# Patient Record
Sex: Female | Born: 1995 | Race: Black or African American | Hispanic: No | Marital: Single | Smoking: Never smoker
Health system: Southern US, Community
[De-identification: ages and names within clinical notes are randomized; demographics above are authoritative.]

## PROBLEM LIST (undated history)

## (undated) ENCOUNTER — Inpatient Hospital Stay (HOSPITAL_COMMUNITY): Payer: Self-pay

## (undated) DIAGNOSIS — J45909 Unspecified asthma, uncomplicated: Secondary | ICD-10-CM

## (undated) DIAGNOSIS — J302 Other seasonal allergic rhinitis: Secondary | ICD-10-CM

## (undated) DIAGNOSIS — K219 Gastro-esophageal reflux disease without esophagitis: Secondary | ICD-10-CM

## (undated) DIAGNOSIS — L309 Dermatitis, unspecified: Secondary | ICD-10-CM

## (undated) HISTORY — PX: OTHER SURGICAL HISTORY: SHX169

## (undated) HISTORY — DX: Unspecified asthma, uncomplicated: J45.909

---
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
## 2015-07-22 ENCOUNTER — Emergency Department (HOSPITAL_BASED_OUTPATIENT_CLINIC_OR_DEPARTMENT_OTHER)
Admission: EM | Admit: 2015-07-22 | Discharge: 2015-07-22 | Disposition: A | Discharge status: Home / Self Care | Payer: Medicaid Other | Attending: Emergency Medicine | Admitting: Emergency Medicine

## 2015-07-22 ENCOUNTER — Encounter (HOSPITAL_BASED_OUTPATIENT_CLINIC_OR_DEPARTMENT_OTHER): Payer: Self-pay

## 2015-07-22 DIAGNOSIS — J45901 Unspecified asthma with (acute) exacerbation: Secondary | ICD-10-CM | POA: Insufficient documentation

## 2015-07-22 MED ORDER — IPRATROPIUM-ALBUTEROL 0.5-2.5 (3) MG/3ML IN SOLN
3.0000 mL | Freq: Four times a day (QID) | RESPIRATORY_TRACT | Status: DC
  Administered 2015-07-22: 3 mL via RESPIRATORY_TRACT
  Filled 2015-07-22: qty 3

## 2015-07-22 MED ORDER — ALBUTEROL SULFATE HFA 108 (90 BASE) MCG/ACT IN AERS
2.0000 | INHALATION_SPRAY | Freq: Once | RESPIRATORY_TRACT | Status: AC
  Administered 2015-07-22: 2 via RESPIRATORY_TRACT
  Filled 2015-07-22: qty 6.7

## 2015-07-22 MED ORDER — DEXAMETHASONE SODIUM PHOSPHATE 10 MG/ML IJ SOLN
10.0000 mg | Freq: Once | INTRAMUSCULAR | Status: AC
  Administered 2015-07-22: 10 mg via INTRAMUSCULAR
  Filled 2015-07-22: qty 1

## 2015-07-22 MED ORDER — ALBUTEROL SULFATE (2.5 MG/3ML) 0.083% IN NEBU
2.5000 mg | INHALATION_SOLUTION | Freq: Once | RESPIRATORY_TRACT | Status: AC
  Administered 2015-07-22: 2.5 mg via RESPIRATORY_TRACT
  Filled 2015-07-22: qty 3

## 2015-07-22 NOTE — ED Notes (Signed)
Discharge instructions understood by patient. Pt ambulatory, A&O x 4 at discharge.

## 2015-07-22 NOTE — ED Notes (Signed)
RT at bedside.

## 2015-07-22 NOTE — ED Notes (Signed)
Pt reports asthma attack x 30-40 minutes. Pt reports she doesn't have inhaler with her.

## 2015-07-22 NOTE — ED Provider Notes (Signed)
CSN: [id number]     Arrival date & time 07/22/15  0240 History   First MD Initiated Contact with Patient 07/22/15 [phone]     Chief Complaint  Patient presents with  . Asthma     (Consider location/radiation/quality/duration/timing/severity/associated sxs/prior Treatment) HPI  Michele Jennings is a 20 y.o. female withPast history of asthma and allergies presenting today with an asthma exacerbation.  Patient states this began 40 minutes prior to arrival. She left her albuterol inhaler at her father's house. She states she has had allergies for the last couple days with difficulty breathing just began tonight. She has had a runny nose but no cough. No fevers. She denies sick contacts. There are no further complaints.  10 Systems reviewed and are negative for acute change except as noted in the HPI.     Past Medical History  Diagnosis Date  . Asthma   . Seasonal allergies    Past Surgical History  Procedure Laterality Date  . Lump removed from left leg.     No family history on file. Social History  Substance Use Topics  . Smoking status: Never Smoker   . Smokeless tobacco: None  . Alcohol Use: No   OB History    No data available     Review of Systems    Allergies  Review of patient's allergies indicates no known allergies.  Home Medications   Prior to Admission medications   Medication Sig Start Date End Date Taking? Authorizing Provider  albuterol (PROVENTIL) (2.5 MG/3ML) 0.083% nebulizer solution Take 2.5 mg by nebulization every 6 (six) hours as needed for wheezing or shortness of breath.    Historical Provider, MD  amoxicillin (AMOXIL) 500 MG capsule Take 1 capsule (500 mg total) by mouth 3 (three) times daily. 10/27/14   Nicole Pisciotta, PA-C  predniSONE (DELTASONE) 20 MG tablet 3 tabs po day one, then 2 po daily x 4 days 10/27/14   Joni Reining Pisciotta, PA-C   BP 133/84 mmHg  Pulse 82  Temp(Src) 97.7 F (36.5 C) (Oral)  Resp 18  Ht 5' (1.524 m)  Wt 134 lb  (60.782 kg)  BMI 26.17 kg/m2  SpO2 100%  LMP 07/03/2015 Physical Exam  Constitutional: She is oriented to person, place, and time. She appears well-developed and well-nourished. No distress.  HENT:  Head: Normocephalic and atraumatic.  Nose: Nose normal.  Mouth/Throat: Oropharynx is clear and moist. No oropharyngeal exudate.  Eyes: Conjunctivae and EOM are normal. Pupils are equal, round, and reactive to light. No scleral icterus.  Neck: Normal range of motion. Neck supple. No JVD present. No tracheal deviation present. No thyromegaly present.  Cardiovascular: Normal rate, regular rhythm and normal heart sounds.  Exam reveals no gallop and no friction rub.   No murmur heard. Pulmonary/Chest: Effort normal and breath sounds normal. No respiratory distress. She has no wheezes. She exhibits no tenderness.  Abdominal: Soft. Bowel sounds are normal. She exhibits no distension and no mass. There is no tenderness. There is no rebound and no guarding.  Musculoskeletal: Normal range of motion. She exhibits no edema or tenderness.  Lymphadenopathy:    She has no cervical adenopathy.  Neurological: She is alert and oriented to person, place, and time. No cranial nerve deficit. She exhibits normal muscle tone.  Skin: Skin is warm and dry. No rash noted. No erythema. No pallor.  Nursing note and vitals reviewed.   ED Course  Procedures (including critical care time) Labs Review Labs Reviewed - No data to display  Imaging Review No results found. I have personally reviewed and evaluated these images and lab results as part of my medical decision-making.   EKG Interpretation None      MDM   Final diagnoses:  Asthma exacerbation  Patient presents to the emergency department for asthma exacerbation. On my examination her physical exam of her lungs are normal. She received albuterol treatments prior to my evaluation. She states she feels much better at this time. I would like to 5 days of  prednisone however she states she cannot take pills. Instead we'll give an IM injection of Decadron in the emergency department.  She was given albuterol inhaler to go home with. She appears well and in no acute distress, vital signs remain within her normal limits and she is safe for DC.  Tomasita CrumbleAdeleke Donold Marotto, MD 07/22/15 [phone]

## 2015-07-22 NOTE — ED Notes (Signed)
MD at bedside. 

## 2015-07-22 NOTE — Discharge Instructions (Signed)
Asthma Attack Prevention Ms. Laural Benes, see a primary care doctor within 3 days for close follow up.  Use albuterol inhaler every 6 hours, 2 puffs, for the next 2 days.  Then only use it as needed.  For any worsening symptoms, come back to the ED immediately.  Thank you. While you may not be able to control the fact that you have asthma, you can take actions to prevent asthma attacks. The best way to prevent asthma attacks is to maintain good control of your asthma. You can achieve this by:  Taking your medicines as directed.  Avoiding things that can irritate your airways or make your asthma symptoms worse (asthma triggers).  Keeping track of how well your asthma is controlled and of any changes in your symptoms.  Responding quickly to worsening asthma symptoms (asthma attack).  Seeking emergency care when it is needed. WHAT ARE SOME WAYS TO PREVENT AN ASTHMA ATTACK? Have a Plan Work with your health care provider to create a written plan for managing and treating your asthma attacks (asthma action plan). This plan includes:  A list of your asthma triggers and how you can avoid them.  Information on when medicines should be taken and when their dosages should be changed.  The use of a device that measures how well your lungs are working (peak flow meter). Monitor Your Asthma Use your peak flow meter and record your results in a journal every day. A drop in your peak flow numbers on one or more days may indicate the start of an asthma attack. This can happen even before you start to feel symptoms. You can prevent an asthma attack from getting worse by following the steps in your asthma action plan. Avoid Asthma Triggers Work with your asthma health care provider to find out what your asthma triggers are. This can be done by:  Allergy testing.  Keeping a journal that notes when asthma attacks occur and the factors that may have contributed to them.  Determining if there are other medical  conditions that are making your asthma worse. Once you have determined your asthma triggers, take steps to avoid them. This may include avoiding excessive or prolonged exposure to:  Dust. Have someone dust and vacuum your home for you once or twice a week. Using a high-efficiency particulate arrestance (HEPA) vacuum is best.  Smoke. This includes campfire smoke, forest fire smoke, and secondhand smoke from tobacco products.  Pet dander. Avoid contact with animals that you know you are allergic to.  Allergens from trees, grasses or pollens. Avoid spending a lot of time outdoors when pollen counts are high, and on very windy days.  Very cold, dry, or humid air.  Mold.  Foods that contain high amounts of sulfites.  Strong odors.  Outdoor air pollutants, such as Museum/gallery exhibitions officer.  Indoor air pollutants, such as aerosol sprays and fumes from household cleaners.  Household pests, including dust mites and cockroaches, and pest droppings.  Certain medicines, including NSAIDs. Always talk to your health care provider before stopping or starting any new medicines. Medicines Take over-the-counter and prescription medicines only as told by your health care provider. Many asthma attacks can be prevented by carefully following your medicine schedule. Taking your medicines correctly is especially important when you cannot avoid certain asthma triggers. Act Quickly If an asthma attack does happen, acting quickly can decrease how severe it is and how long it lasts. Take these steps:   Pay attention to your symptoms. If you are  coughing, wheezing, or having difficulty breathing, do not wait to see if your symptoms go away on their own. Follow your asthma action plan.  If you have followed your asthma action plan and your symptoms are not improving, call your health care provider or seek immediate medical care at the nearest hospital. It is important to note how often you need to use your fast-acting  rescue inhaler. If you are using your rescue inhaler more often, it may mean that your asthma is not under control. Adjusting your asthma treatment plan may help you to prevent future asthma attacks and help you to gain better control of your condition. HOW CAN I PREVENT AN ASTHMA ATTACK WHEN I EXERCISE? Follow advice from your health care provider about whether you should use your fast-acting inhaler before exercising. Many people with asthma experience exercise-induced bronchoconstriction (EIB). This condition often worsens during vigorous exercise in cold, humid, or dry environments. Usually, people with EIB can stay very active by pre-treating with a fast-acting inhaler before exercising.   This information is not intended to replace advice given to you by your health care provider. Make sure you discuss any questions you have with your health care provider.   Document Released: 01/14/2009 Document Revised: 10/17/2014 Document Reviewed: 06/28/2014 Elsevier Interactive Patient Education [employer]2016 [employer] Inc.

## 2016-01-10 ENCOUNTER — Encounter (HOSPITAL_COMMUNITY): Payer: Self-pay | Admitting: *Deleted

## 2016-01-10 DIAGNOSIS — R0602 Shortness of breath: Secondary | ICD-10-CM | POA: Insufficient documentation

## 2016-01-10 DIAGNOSIS — Z5321 Procedure and treatment not carried out due to patient leaving prior to being seen by health care provider: Secondary | ICD-10-CM | POA: Insufficient documentation

## 2016-01-10 MED ORDER — ALBUTEROL SULFATE (2.5 MG/3ML) 0.083% IN NEBU
2.5000 mg | INHALATION_SOLUTION | Freq: Once | RESPIRATORY_TRACT | Status: AC
  Administered 2016-01-10: 2.5 mg via RESPIRATORY_TRACT

## 2016-01-10 MED ORDER — IPRATROPIUM-ALBUTEROL 0.5-2.5 (3) MG/3ML IN SOLN
RESPIRATORY_TRACT | Status: AC
  Filled 2016-01-10: qty 3

## 2016-01-10 MED ORDER — IPRATROPIUM-ALBUTEROL 0.5-2.5 (3) MG/3ML IN SOLN
3.0000 mL | Freq: Once | RESPIRATORY_TRACT | Status: AC
  Administered 2016-01-10: 3 mL via RESPIRATORY_TRACT

## 2016-01-10 MED ORDER — ALBUTEROL SULFATE (2.5 MG/3ML) 0.083% IN NEBU
INHALATION_SOLUTION | RESPIRATORY_TRACT | Status: AC
  Filled 2016-01-10: qty 3

## 2016-01-10 NOTE — ED Triage Notes (Addendum)
H/o asthma, non-smoker, c/o sob, wheezing and productive cough (clear), sx onset 2 hrs ago, describes onset as: "was w/o sx, but was looking for my inhaler, then when I couldn't find it, my sx started, like anxiety". Wheezing noted. Pt alert, NAD, calm. (denies: fever, nvd). mentions recent cold sx. Also triggered by weather. Last used inhaler yesterday. Never hospitalized for asthma, last steroid in 10/2015.

## 2016-01-10 NOTE — ED Notes (Signed)
Ambulatory to w/r, encouraged to stay.

## 2016-01-11 ENCOUNTER — Emergency Department (HOSPITAL_COMMUNITY)
Admission: EM | Admit: 2016-01-11 | Discharge: 2016-01-11 | Disposition: A | Discharge status: Home / Self Care | Payer: Self-pay | Attending: Emergency Medicine | Admitting: Emergency Medicine

## 2016-01-11 HISTORY — DX: Dermatitis, unspecified: L30.9

## 2016-01-11 NOTE — ED Notes (Signed)
The pt was told  Her name had been moved to a room in the back  But she wanted a sample inhaler which we dont have and I told her that.  She left

## 2016-08-15 ENCOUNTER — Encounter (HOSPITAL_BASED_OUTPATIENT_CLINIC_OR_DEPARTMENT_OTHER): Payer: Self-pay | Admitting: Emergency Medicine

## 2016-08-15 ENCOUNTER — Emergency Department (HOSPITAL_BASED_OUTPATIENT_CLINIC_OR_DEPARTMENT_OTHER)
Admission: EM | Admit: 2016-08-15 | Discharge: 2016-08-15 | Disposition: A | Discharge status: Home / Self Care | Payer: Self-pay | Attending: Emergency Medicine | Admitting: Emergency Medicine

## 2016-08-15 DIAGNOSIS — K130 Diseases of lips: Secondary | ICD-10-CM | POA: Insufficient documentation

## 2016-08-15 DIAGNOSIS — J45909 Unspecified asthma, uncomplicated: Secondary | ICD-10-CM | POA: Insufficient documentation

## 2016-08-15 DIAGNOSIS — N390 Urinary tract infection, site not specified: Secondary | ICD-10-CM | POA: Insufficient documentation

## 2016-08-15 DIAGNOSIS — K131 Cheek and lip biting: Secondary | ICD-10-CM

## 2016-08-15 DIAGNOSIS — Z79899 Other long term (current) drug therapy: Secondary | ICD-10-CM | POA: Insufficient documentation

## 2016-08-15 LAB — URINALYSIS, MICROSCOPIC (REFLEX): RBC / HPF: NONE SEEN RBC/hpf (ref 0–5)

## 2016-08-15 LAB — URINALYSIS, ROUTINE W REFLEX MICROSCOPIC
Bilirubin Urine: NEGATIVE
GLUCOSE, UA: NEGATIVE mg/dL
Hgb urine dipstick: NEGATIVE
Ketones, ur: NEGATIVE mg/dL
Nitrite: NEGATIVE
PH: 7 (ref 5.0–8.0)
PROTEIN: NEGATIVE mg/dL
SPECIFIC GRAVITY, URINE: 1.022 (ref 1.005–1.030)

## 2016-08-15 LAB — PREGNANCY, URINE: Preg Test, Ur: NEGATIVE

## 2016-08-15 MED ORDER — FOSFOMYCIN TROMETHAMINE 3 G PO PACK
3.0000 g | PACK | Freq: Once | ORAL | Status: AC
  Administered 2016-08-15: 3 g via ORAL
  Filled 2016-08-15: qty 3

## 2016-08-15 NOTE — ED Notes (Signed)
ED Provider at bedside. 

## 2016-08-15 NOTE — ED Triage Notes (Addendum)
Lower abd pain x 3 weeks with urinary frequency. Also c/o mouth ulcers.

## 2016-08-15 NOTE — ED Provider Notes (Signed)
[facility]   CSN: [id number] Arrival date & time: 08/15/16  1242     History   Chief Complaint Chief Complaint  Patient presents with  . Abdominal Pain    HPI Michele Jennings is a 21 y.o. female.  21 yo F with a chief complaint of lower abdominal pain. Going on for the past month or so. Also having some sores in her mouth. Denies fevers or chills denies vomiting. Denies flank pain. Dysuria and increased frequency and hesitancy.   The history is provided by the patient.  Abdominal Pain   This is a chronic problem. The current episode started more than 1 week ago. The problem occurs constantly. The problem has not changed since onset.The pain is located in the LLQ and RLQ. The quality of the pain is aching and dull. The pain is at a severity of 6/10. The pain is moderate. Associated symptoms include dysuria and frequency. Pertinent negatives include fever, nausea, vomiting, headaches, arthralgias and myalgias. Nothing aggravates the symptoms. Nothing relieves the symptoms.    Past Medical History:  Diagnosis Date  . Asthma   . Eczema   . Seasonal allergies     There are no active problems to display for this patient.   Past Surgical History:  Procedure Laterality Date  . lump removed from left leg.      OB History    No data available       Home Medications    Prior to Admission medications   Medication Sig Start Date End Date Taking? Authorizing Provider  albuterol (PROVENTIL) (2.5 MG/3ML) 0.083% nebulizer solution Take 2.5 mg by nebulization every 6 (six) hours as needed for wheezing or shortness of breath.    [provider]  amoxicillin (AMOXIL) 500 MG capsule Take 1 capsule (500 mg total) by mouth 3 (three) times daily. 10/27/14   Pisciotta, Joni ReiningNicole, PA-C  predniSONE (DELTASONE) 20 MG tablet 3 tabs po day one, then 2 po daily x 4 days 10/27/14   Pisciotta, Joni ReiningNicole, PA-C    Family History No family history on file.  Social  History Social History  Substance Use Topics  . Smoking status: Never Smoker  . Smokeless tobacco: Never Used  . Alcohol use No     Allergies   Dairy aid [lactase] and Shellfish allergy   Review of Systems Review of Systems  Constitutional: Negative for chills and fever.  HENT: Positive for mouth sores. Negative for congestion and rhinorrhea.   Eyes: Negative for redness and visual disturbance.  Respiratory: Negative for shortness of breath and wheezing.   Cardiovascular: Negative for chest pain and palpitations.  Gastrointestinal: Positive for abdominal pain. Negative for nausea and vomiting.  Genitourinary: Positive for dysuria and frequency. Negative for urgency.  Musculoskeletal: Negative for arthralgias and myalgias.  Skin: Negative for pallor and wound.  Neurological: Negative for dizziness and headaches.     Physical Exam Updated Vital Signs BP (!) 142/101 (BP Location: Left Arm)   Pulse 88   Temp 98.3 F (36.8 C) (Oral)   Resp 16   Ht 5' (1.524 m)   Wt 71.4 kg (157 lb 6.5 oz)   LMP 07/26/2016   SpO2 100%   BMI 30.74 kg/m   Physical Exam  Constitutional: She is oriented to person, place, and time. She appears well-developed and well-nourished. No distress.  HENT:  Head: Normocephalic and atraumatic.  No noted intraoral lesions, patient has a linear callus along the tooth line to the right  inner cheek.  Eyes: EOM are normal. Pupils are equal, round, and reactive to light.  Neck: Normal range of motion. Neck supple.  Cardiovascular: Normal rate and regular rhythm.  Exam reveals no gallop and no friction rub.   No murmur heard. Pulmonary/Chest: Effort normal. She has no wheezes. She has no rales.  Abdominal: Soft. She exhibits no distension and no mass. There is no tenderness. There is no guarding.  Musculoskeletal: She exhibits no edema or tenderness.  Neurological: She is alert and oriented to person, place, and time.  Skin: Skin is warm and dry. She is  not diaphoretic.  Psychiatric: She has a normal mood and affect. Her behavior is normal.  Nursing note and vitals reviewed.    ED Treatments / Results  Labs (all labs ordered are listed, but only abnormal results are displayed) Labs Reviewed  URINALYSIS, ROUTINE W REFLEX MICROSCOPIC - Abnormal; Notable for the following:       Result Value   APPearance CLOUDY (*)    Leukocytes, UA TRACE (*)    All other components within normal limits  URINALYSIS, MICROSCOPIC (REFLEX) - Abnormal; Notable for the following:    Bacteria, UA FEW (*)    Squamous Epithelial / LPF 6-30 (*)    All other components within normal limits  PREGNANCY, URINE    EKG  EKG Interpretation None       Radiology No results found.  Procedures Procedures (including critical care time)  Medications Ordered in ED Medications  fosfomycin (MONUROL) packet 3 g (3 g Oral Given 08/15/16 1348)     Initial Impression / Assessment and Plan / ED Course  I have reviewed the triage vital signs and the nursing notes.  Pertinent labs & imaging results that were available during my care of the patient were reviewed by me and considered in my medical decision making (see chart for details).     21 yo F With multiple complaints. Mostly they're dysuria or gross frequency and hesitancy. For the past month. She is also complaining of mouth sores which I'm unable to appreciate on exam. She has some findings of likely biting the inside of her mouth. Will obtain a UA.  Treat for possible UTI.  PCP follow up.  8:34 PM:  I have discussed the diagnosis/risks/treatment options with the patient and believe the pt to be eligible for discharge home to follow-up with PCP. We also discussed returning to the ED immediately if new or worsening sx occur. We discussed the sx which are most concerning (e.g., sudden worsening pain, fever, inability to tolerate by mouth) that necessitate immediate return. Medications administered to the patient  during their visit and any new prescriptions provided to the patient are listed below.  Medications given during this visit Medications  fosfomycin (MONUROL) packet 3 g (3 g Oral Given 08/15/16 1348)     The patient appears reasonably screen and/or stabilized for discharge and I doubt any other medical condition or other [facility] requiring further screening, evaluation, or treatment in the ED at this time prior to discharge.    Final Clinical Impressions(s) / ED Diagnoses   Final diagnoses:  Lower urinary tract infectious disease  Cheek biting    New Prescriptions Discharge Medication List as of 08/15/2016  1:42 PM       Melene Plan, DO 08/15/16 2034

## 2016-08-15 NOTE — Discharge Instructions (Signed)
Follow up with a PCP.  Return for fever, inability to eat or drink.

## 2016-08-29 ENCOUNTER — Emergency Department (HOSPITAL_BASED_OUTPATIENT_CLINIC_OR_DEPARTMENT_OTHER)
Admission: EM | Admit: 2016-08-29 | Discharge: 2016-08-29 | Disposition: A | Discharge status: Home / Self Care | Payer: Self-pay | Attending: Emergency Medicine | Admitting: Emergency Medicine

## 2016-08-29 ENCOUNTER — Encounter (HOSPITAL_BASED_OUTPATIENT_CLINIC_OR_DEPARTMENT_OTHER): Payer: Self-pay | Admitting: *Deleted

## 2016-08-29 DIAGNOSIS — Z79899 Other long term (current) drug therapy: Secondary | ICD-10-CM | POA: Insufficient documentation

## 2016-08-29 DIAGNOSIS — J4521 Mild intermittent asthma with (acute) exacerbation: Secondary | ICD-10-CM | POA: Insufficient documentation

## 2016-08-29 MED ORDER — BECLOMETHASONE DIPROPIONATE 40 MCG/ACT IN AERS: 2.0000 | INHALATION_SPRAY | Freq: Two times a day (BID) | RESPIRATORY_TRACT | 12 refills | Status: DC

## 2016-08-29 MED ORDER — IPRATROPIUM-ALBUTEROL 0.5-2.5 (3) MG/3ML IN SOLN
RESPIRATORY_TRACT | Status: AC
  Administered 2016-08-29: 3 mL via RESPIRATORY_TRACT
  Filled 2016-08-29: qty 3

## 2016-08-29 MED ORDER — ALBUTEROL SULFATE (2.5 MG/3ML) 0.083% IN NEBU
INHALATION_SOLUTION | RESPIRATORY_TRACT | Status: AC
  Administered 2016-08-29: 2.5 mg via RESPIRATORY_TRACT
  Filled 2016-08-29: qty 3

## 2016-08-29 MED ORDER — ALBUTEROL SULFATE HFA 108 (90 BASE) MCG/ACT IN AERS
2.0000 | INHALATION_SPRAY | RESPIRATORY_TRACT | Status: DC | PRN
  Administered 2016-08-29: 2 via RESPIRATORY_TRACT
  Filled 2016-08-29: qty 6.7

## 2016-08-29 MED ORDER — ALBUTEROL SULFATE (2.5 MG/3ML) 0.083% IN NEBU
2.5000 mg | INHALATION_SOLUTION | Freq: Once | RESPIRATORY_TRACT | Status: AC
  Administered 2016-08-29: 2.5 mg via RESPIRATORY_TRACT

## 2016-08-29 MED ORDER — IPRATROPIUM-ALBUTEROL 0.5-2.5 (3) MG/3ML IN SOLN
3.0000 mL | Freq: Once | RESPIRATORY_TRACT | Status: AC
  Administered 2016-08-29: 3 mL via RESPIRATORY_TRACT

## 2016-08-29 NOTE — ED Provider Notes (Signed)
[facility]   CSN: [id number] Arrival date & time: 08/29/16  1134     History   Chief Complaint Chief Complaint  Patient presents with  . Shortness of Breath    HPI Michele Jennings is a 21 y.o. female.  Patient with a history of asthma presents with increased wheezing that started today. No fever or cough. Symptoms feel like uncontrolled asthma. She reports being out of her inhaler and Qvar for the past 2 months. No vomiting.    The history is provided by the patient. No language interpreter was used.  Shortness of Breath  Associated symptoms include wheezing. Pertinent negatives include no fever, no headaches, no cough, no chest pain and no abdominal pain.    Past Medical History:  Diagnosis Date  . Asthma   . Eczema   . Seasonal allergies     There are no active problems to display for this patient.   Past Surgical History:  Procedure Laterality Date  . lump removed from left leg.      OB History    No data available       Home Medications    Prior to Admission medications   Medication Sig Start Date End Date Taking? Authorizing Provider  albuterol (PROVENTIL) (2.5 MG/3ML) 0.083% nebulizer solution Take 2.5 mg by nebulization every 6 (six) hours as needed for wheezing or shortness of breath.    [provider]  amoxicillin (AMOXIL) 500 MG capsule Take 1 capsule (500 mg total) by mouth 3 (three) times daily. 10/27/14   Pisciotta, Joni ReiningNicole, PA-C  predniSONE (DELTASONE) 20 MG tablet 3 tabs po day one, then 2 po daily x 4 days 10/27/14   Pisciotta, Joni ReiningNicole, PA-C    Family History No family history on file.  Social History Social History  Substance Use Topics  . Smoking status: Never Smoker  . Smokeless tobacco: Never Used  . Alcohol use No     Allergies   Dairy aid [lactase] and Shellfish allergy   Review of Systems Review of Systems  Constitutional: Negative for chills and fever.  HENT: Negative.  Negative for  congestion.   Respiratory: Positive for shortness of breath and wheezing. Negative for cough.   Cardiovascular: Negative for chest pain.  Gastrointestinal: Negative.  Negative for abdominal pain and nausea.  Musculoskeletal: Negative.  Negative for myalgias.  Neurological: Negative.  Negative for syncope, weakness and headaches.     Physical Exam Updated Vital Signs BP 114/87 (BP Location: Left Arm)   Pulse 80   Temp 98.7 F (37.1 C) (Oral)   Resp 20   Ht 5' (1.524 m)   Wt 71.2 kg (157 lb)   LMP 08/24/2016 (Exact Date)   SpO2 99%   BMI 30.66 kg/m   Physical Exam  Constitutional: She is oriented to person, place, and time. She appears well-developed and well-nourished.  HENT:  Head: Normocephalic.  Neck: Normal range of motion. Neck supple.  Cardiovascular: Normal rate and regular rhythm.   Pulmonary/Chest: Effort normal and breath sounds normal. She has no wheezes. She has no rales. She exhibits no tenderness.  Examined after first breathing treatment in the ED.  Abdominal: Soft. Bowel sounds are normal. There is no tenderness. There is no rebound and no guarding.  Musculoskeletal: Normal range of motion. She exhibits no edema.  Neurological: She is alert and oriented to person, place, and time.  Skin: Skin is warm and dry. No rash noted.  Psychiatric: She has a normal mood  and affect.     ED Treatments / Results  Labs (all labs ordered are listed, but only abnormal results are displayed) Labs Reviewed - No data to display  EKG  EKG Interpretation None       Radiology No results found.  Procedures Procedures (including critical care time)  Medications Ordered in ED Medications  ipratropium-albuterol (DUONEB) 0.5-2.5 (3) MG/3ML nebulizer solution 3 mL (3 mLs Nebulization Given 08/29/16 1158)  albuterol (PROVENTIL) (2.5 MG/3ML) 0.083% nebulizer solution 2.5 mg (2.5 mg Nebulization Given 08/29/16 1158)     Initial Impression / Assessment and Plan / ED Course   I have reviewed the triage vital signs and the nursing notes.  Pertinent labs & imaging results that were available during my care of the patient were reviewed by me and considered in my medical decision making (see chart for details).     Patient given a nebulizer treatment for symptoms of asthma and is feeling back to her baseline. She feels comfortable with discharge home. Will give Rx for Qvar and provide inhaler prior to discharge.  Final Clinical Impressions(s) / ED Diagnoses   Final diagnoses:  None   1. Asthma  New Prescriptions New Prescriptions   No medications on file     Danne Harbor 08/29/16 1310    Shaune Pollack, MD 08/30/16 [phone]

## 2016-08-29 NOTE — ED Triage Notes (Signed)
Patient states she has had problems breathing all morning, and her asthma if flaring up.

## 2016-11-08 ENCOUNTER — Emergency Department (HOSPITAL_COMMUNITY): Payer: Self-pay

## 2016-11-08 ENCOUNTER — Encounter (HOSPITAL_COMMUNITY): Payer: Self-pay | Admitting: Emergency Medicine

## 2016-11-08 ENCOUNTER — Emergency Department (HOSPITAL_COMMUNITY)
Admission: EM | Admit: 2016-11-08 | Discharge: 2016-11-08 | Disposition: A | Discharge status: Home / Self Care | Payer: Self-pay | Attending: Emergency Medicine | Admitting: Emergency Medicine

## 2016-11-08 DIAGNOSIS — J4521 Mild intermittent asthma with (acute) exacerbation: Secondary | ICD-10-CM | POA: Insufficient documentation

## 2016-11-08 DIAGNOSIS — Z79899 Other long term (current) drug therapy: Secondary | ICD-10-CM | POA: Insufficient documentation

## 2016-11-08 LAB — CBC
HEMATOCRIT: 36.7 % (ref 36.0–46.0)
HEMOGLOBIN: 11.7 g/dL — AB (ref 12.0–15.0)
MCH: 28.1 pg (ref 26.0–34.0)
MCHC: 31.9 g/dL (ref 30.0–36.0)
MCV: 88 fL (ref 78.0–100.0)
Platelets: 262 10*3/uL (ref 150–400)
RBC: 4.17 MIL/uL (ref 3.87–5.11)
RDW: 12.9 % (ref 11.5–15.5)
WBC: 7.2 10*3/uL (ref 4.0–10.5)

## 2016-11-08 LAB — BASIC METABOLIC PANEL
ANION GAP: 7 (ref 5–15)
BUN: 13 mg/dL (ref 6–20)
CHLORIDE: 103 mmol/L (ref 101–111)
CO2: 23 mmol/L (ref 22–32)
Calcium: 9 mg/dL (ref 8.9–10.3)
Creatinine, Ser: 0.88 mg/dL (ref 0.44–1.00)
GFR calc Af Amer: >60 mL/min (ref >60)
GFR calc non Af Amer: >60 mL/min (ref >60)
Glucose, Bld: 75 mg/dL (ref 65–99)
POTASSIUM: 3.8 mmol/L (ref 3.5–5.1)
SODIUM: 133 mmol/L — AB (ref 135–145)

## 2016-11-08 LAB — I-STAT TROPONIN, ED: Troponin i, poc: 0 ng/mL (ref 0.00–0.08)

## 2016-11-08 MED ORDER — PREDNISONE 20 MG PO TABS: 20.0000 mg | ORAL_TABLET | Freq: Two times a day (BID) | ORAL | 0 refills | Status: DC

## 2016-11-08 MED ORDER — PREDNISONE 20 MG PO TABS
60.0000 mg | ORAL_TABLET | Freq: Once | ORAL | Status: AC
  Administered 2016-11-08: 60 mg via ORAL
  Filled 2016-11-08: qty 3

## 2016-11-08 MED ORDER — ALBUTEROL SULFATE (2.5 MG/3ML) 0.083% IN NEBU
INHALATION_SOLUTION | RESPIRATORY_TRACT | Status: AC
  Filled 2016-11-08: qty 6

## 2016-11-08 MED ORDER — ALBUTEROL SULFATE HFA 108 (90 BASE) MCG/ACT IN AERS: 1.0000 | INHALATION_SPRAY | Freq: Four times a day (QID) | RESPIRATORY_TRACT | 0 refills | Status: DC | PRN

## 2016-11-08 MED ORDER — ALBUTEROL SULFATE (2.5 MG/3ML) 0.083% IN NEBU
5.0000 mg | INHALATION_SOLUTION | Freq: Once | RESPIRATORY_TRACT | Status: AC
  Administered 2016-11-08: 5 mg via RESPIRATORY_TRACT

## 2016-11-08 NOTE — Discharge Instructions (Signed)
Prednisone 5 days.  Albuterol as needed.

## 2016-11-08 NOTE — ED Triage Notes (Signed)
C/o sob and tightness to center of chest x 2 days.  Faint wheezing- Using inhaler at home with some relief.

## 2016-11-08 NOTE — ED Notes (Signed)
Pt came in with SOB and chest tightness. Given breathing treatment in triage.  States she feels better.  Hx of asthma.

## 2016-11-17 NOTE — ED Provider Notes (Signed)
[facility]ER[facility] DEPT Provider Note   CSN: [id number] Arrival date & time: 11/08/16  1729     History   Chief Complaint Chief Complaint  Patient presents with  . Chest Pain  . Shortness of Breath    HPI Michele Jennings is a 21 y.o. female.  Chief complaint is shortness of breath with chest tightness and wheezing  HPI: 20 year old female. History of reactive airways disease. 2 days of cough and wheezing. Using the inhaler at home with some relief. No sputum production. No fever. No chest pain.  Past Medical History:  Diagnosis Date  . Asthma   . Eczema   . Seasonal allergies     There are no active problems to display for this patient.   Past Surgical History:  Procedure Laterality Date  . lump removed from left leg.      OB History    No data available       Home Medications    Prior to Admission medications   Medication Sig Start Date End Date Taking? Authorizing Provider  albuterol (PROVENTIL HFA;VENTOLIN HFA) 108 (90 Base) MCG/ACT inhaler Inhale 1-2 puffs into the lungs every 6 (six) hours as needed for wheezing. 11/08/16   Rolland Porter, MD  amoxicillin (AMOXIL) 500 MG capsule Take 1 capsule (500 mg total) by mouth 3 (three) times daily. 10/27/14   Pisciotta, Joni Reining, PA-C  beclomethasone (QVAR) 40 MCG/ACT inhaler Inhale 2 puffs into the lungs 2 (two) times daily. 08/29/16   Elpidio Anis, PA-C  predniSONE (DELTASONE) 20 MG tablet Take 1 tablet (20 mg total) by mouth 2 (two) times daily with a meal. 11/08/16   Rolland Porter, MD    Family History No family history on file.  Social History Social History  Substance Use Topics  . Smoking status: Never Smoker  . Smokeless tobacco: Never Used  . Alcohol use No     Allergies   Dairy aid [lactase] and Shellfish allergy   Review of Systems Review of Systems  Constitutional: Negative for appetite change, chills, diaphoresis, fatigue and fever.  HENT: Negative for mouth sores, sore throat and trouble swallowing.    Eyes: Negative for visual disturbance.  Respiratory: Positive for cough and wheezing. Negative for chest tightness and shortness of breath.   Cardiovascular: Negative for chest pain.  Gastrointestinal: Negative for abdominal distention, abdominal pain, diarrhea, nausea and vomiting.  Endocrine: Negative for polydipsia, polyphagia and polyuria.  Genitourinary: Negative for dysuria, frequency and hematuria.  Musculoskeletal: Negative for gait problem.  Skin: Negative for color change, pallor and rash.  Neurological: Negative for dizziness, syncope, light-headedness and headaches.  Hematological: Does not bruise/bleed easily.  Psychiatric/Behavioral: Negative for behavioral problems and confusion.     Physical Exam Updated Vital Signs BP 128/83 (BP Location: Right Arm)   Pulse 89   Temp 98.5 F (36.9 C) (Oral)   Resp 18   Ht 5' (1.524 m)   Wt 72.6 kg (160 lb)   LMP 10/23/2016   SpO2 100%   BMI 31.25 kg/m   Physical Exam  Constitutional: She is oriented to person, place, and time. She appears well-developed and well-nourished. No distress.  HENT:  Head: Normocephalic.  Eyes: Pupils are equal, round, and reactive to light. Conjunctivae are normal. No scleral icterus.  Neck: Normal range of motion. Neck supple. No thyromegaly present.  Cardiovascular: Normal rate and regular rhythm.  Exam reveals no gallop and no friction rub.   No murmur heard. Pulmonary/Chest: Effort normal. No respiratory distress. She has wheezes.  She has no rales.  End-expiratory wheeze. Bronchus spastic cough. No focal diminished breath sounds. No increased work of breathing, or accessory muscle use.  Abdominal: Soft. Bowel sounds are normal. She exhibits no distension. There is no tenderness. There is no rebound.  Musculoskeletal: Normal range of motion.  Neurological: She is alert and oriented to person, place, and time.  Skin: Skin is warm and dry. No rash noted.  Psychiatric: She has a normal mood and  affect. Her behavior is normal.     ED Treatments / Results  Labs (all labs ordered are listed, but only abnormal results are displayed) Labs Reviewed  BASIC METABOLIC PANEL - Abnormal; Notable for the following:       Result Value   Sodium 133 (*)    All other components within normal limits  CBC - Abnormal; Notable for the following:    Hemoglobin 11.7 (*)    All other components within normal limits  I-STAT TROPONIN, ED    EKG  EKG Interpretation  Date/Time:  Sunday November 08 2016 17:34:30 EDT Ventricular Rate:  84 PR Interval:  144 QRS Duration: 84 QT Interval:  360 QTC Calculation: 425 R Axis:   77 Text Interpretation:  Normal sinus rhythm Normal ECG No significant change since last tracing Confirmed by Gwyneth Sprout ([id number]) on 11/10/2016 12:25:39 PM       Radiology No results found.  Procedures Procedures (including critical care time)  Medications Ordered in ED Medications  albuterol (PROVENTIL) (2.5 MG/3ML) 0.083% nebulizer solution 5 mg (5 mg Nebulization Given 11/08/16 1741)  predniSONE (DELTASONE) tablet 60 mg (60 mg Oral Given 11/08/16 1955)     Initial Impression / Assessment and Plan / ED Course  I have reviewed the triage vital signs and the nursing notes.  Pertinent labs & imaging results that were available during my care of the patient were reviewed by me and considered in my medical decision making (see chart for details).   reassuring labs. Nebulized on chest x-ray. Given albuterol by mouth prednisone. Symptoms improved. Plan discharge home. Continue albuterol. Short course prednisone.   Final Clinical Impressions(s) / ED Diagnoses   Final diagnoses:  Mild intermittent asthma with exacerbation    New Prescriptions Discharge Medication List as of 11/08/2016  7:49 PM    START taking these medications   Details  albuterol (PROVENTIL HFA;VENTOLIN HFA) 108 (90 Base) MCG/ACT inhaler Inhale 1-2 puffs into the lungs every 6 (six) hours  as needed for wheezing., Starting Sun 11/08/2016, Print         Rolland Porter, MD 11/17/16 [phone]

## 2018-03-22 ENCOUNTER — Other Ambulatory Visit: Payer: Self-pay

## 2018-03-22 ENCOUNTER — Encounter (HOSPITAL_BASED_OUTPATIENT_CLINIC_OR_DEPARTMENT_OTHER): Payer: Self-pay | Admitting: *Deleted

## 2018-03-22 ENCOUNTER — Emergency Department (HOSPITAL_BASED_OUTPATIENT_CLINIC_OR_DEPARTMENT_OTHER)
Admission: EM | Admit: 2018-03-22 | Discharge: 2018-03-22 | Disposition: A | Discharge status: Home / Self Care | Payer: No Typology Code available for payment source | Attending: Emergency Medicine | Admitting: Emergency Medicine

## 2018-03-22 DIAGNOSIS — Z5321 Procedure and treatment not carried out due to patient leaving prior to being seen by health care provider: Secondary | ICD-10-CM | POA: Insufficient documentation

## 2018-03-22 DIAGNOSIS — R109 Unspecified abdominal pain: Secondary | ICD-10-CM | POA: Diagnosis not present

## 2018-03-22 LAB — URINALYSIS, ROUTINE W REFLEX MICROSCOPIC
Bilirubin Urine: NEGATIVE
Glucose, UA: NEGATIVE mg/dL
HGB URINE DIPSTICK: NEGATIVE
Ketones, ur: 15 mg/dL — AB
LEUKOCYTE UA: NEGATIVE
Nitrite: NEGATIVE
PH: 7 (ref 5.0–8.0)
Protein, ur: NEGATIVE mg/dL
SPECIFIC GRAVITY, URINE: 1.01 (ref 1.005–1.030)

## 2018-03-22 LAB — PREGNANCY, URINE: Preg Test, Ur: NEGATIVE
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
## 2018-03-23 ENCOUNTER — Encounter (HOSPITAL_BASED_OUTPATIENT_CLINIC_OR_DEPARTMENT_OTHER): Payer: Self-pay | Admitting: Emergency Medicine

## 2018-03-23 ENCOUNTER — Emergency Department (HOSPITAL_BASED_OUTPATIENT_CLINIC_OR_DEPARTMENT_OTHER)
Admission: EM | Admit: 2018-03-23 | Discharge: 2018-03-23 | Disposition: A | Discharge status: Home / Self Care | Payer: No Typology Code available for payment source | Attending: Emergency Medicine | Admitting: Emergency Medicine

## 2018-03-23 ENCOUNTER — Emergency Department (HOSPITAL_BASED_OUTPATIENT_CLINIC_OR_DEPARTMENT_OTHER): Payer: No Typology Code available for payment source

## 2018-03-23 DIAGNOSIS — M25531 Pain in right wrist: Secondary | ICD-10-CM | POA: Insufficient documentation

## 2018-03-23 DIAGNOSIS — J45909 Unspecified asthma, uncomplicated: Secondary | ICD-10-CM | POA: Diagnosis not present

## 2018-03-23 LAB — PREGNANCY, URINE: Preg Test, Ur: NEGATIVE

## 2018-03-23 MED ORDER — IBUPROFEN 800 MG PO TABS: 800.0000 mg | ORAL_TABLET | Freq: Three times a day (TID) | ORAL | 0 refills | Status: DC | PRN
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
## 2018-03-23 NOTE — ED Notes (Signed)
Patient transported to X-ray 
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
## 2019-03-16 ENCOUNTER — Other Ambulatory Visit: Payer: Self-pay

## 2019-03-16 ENCOUNTER — Encounter (HOSPITAL_COMMUNITY): Payer: Self-pay | Admitting: Emergency Medicine

## 2019-03-16 ENCOUNTER — Emergency Department (HOSPITAL_COMMUNITY)
Admission: EM | Admit: 2019-03-16 | Discharge: 2019-03-16 | Disposition: A | Discharge status: Home / Self Care | Payer: Medicaid Other | Attending: Emergency Medicine | Admitting: Emergency Medicine

## 2019-03-16 DIAGNOSIS — J45909 Unspecified asthma, uncomplicated: Secondary | ICD-10-CM | POA: Insufficient documentation

## 2019-03-16 DIAGNOSIS — K0889 Other specified disorders of teeth and supporting structures: Secondary | ICD-10-CM | POA: Insufficient documentation

## 2019-03-16 DIAGNOSIS — Z79899 Other long term (current) drug therapy: Secondary | ICD-10-CM | POA: Insufficient documentation

## 2019-03-16 NOTE — ED Triage Notes (Signed)
Pt c/o bumps in her mouth and her wisdom teeth coming in.

## 2019-03-16 NOTE — ED Provider Notes (Signed)
[place] DEPT Provider Note   CSN: [id number] Arrival date & time: 03/16/19  1131     History Chief Complaint  Patient presents with  . Dental Pain    Michele Jennings is a 24 y.o. female.  24 yo F with a chief complaints of diffuse dental pain.  Is worse to the posterior aspect of the jaw on both upper and lower on both sides.  She also has noticed 2 lumps that she is not sure what they are.  No fevers.  Has had some mild sore throat.  No cough or congestion.  Has not seen a dentist in some time.  The history is provided by the patient.  Dental Pain Location:  Upper and lower Upper teeth location:  1/RU 3rd molar and 16/LU 3rd molar Lower teeth location:  17/LL 3rd molar and 32/RL 3rd molar Severity:  Moderate Onset quality:  Sudden Duration:  2 days Timing:  Constant Progression:  Worsening Chronicity:  New Relieved by:  Nothing Worsened by:  Nothing Ineffective treatments:  None tried Associated symptoms: no congestion, no fever and no headaches        Past Medical History:  Diagnosis Date  . Asthma   . Eczema   . Seasonal allergies     There are no problems to display for this patient.   Past Surgical History:  Procedure Laterality Date  . lump removed from left leg.       OB History   No obstetric history on file.     No family history on file.  Social History   Tobacco Use  . Smoking status: Never Smoker  . Smokeless tobacco: Never Used  Substance Use Topics  . Alcohol use: No  . Drug use: No    Home Medications Prior to Admission medications   Medication Sig Start Date End Date Taking? Authorizing Provider  albuterol (PROVENTIL HFA;VENTOLIN HFA) 108 (90 Base) MCG/ACT inhaler Inhale 1-2 puffs into the lungs every 6 (six) hours as needed for wheezing. 11/08/16   Tanna Furry, MD  beclomethasone (QVAR) 40 MCG/ACT inhaler Inhale 2 puffs into the lungs 2 (two) times daily. 08/29/16   Charlann Lange, PA-C    ibuprofen (ADVIL,MOTRIN) 800 MG tablet Take 1 tablet (800 mg total) by mouth every 8 (eight) hours as needed. 03/23/18   Long, Wonda Olds, MD    Allergies    Dairy aid [lactase] and Shellfish allergy  Review of Systems   Review of Systems  Constitutional: Negative for chills and fever.  HENT: Positive for dental problem. Negative for congestion and rhinorrhea.   Eyes: Negative for redness and visual disturbance.  Respiratory: Negative for shortness of breath and wheezing.   Cardiovascular: Negative for chest pain and palpitations.  Gastrointestinal: Negative for nausea and vomiting.  Genitourinary: Negative for dysuria and urgency.  Musculoskeletal: Negative for arthralgias and myalgias.  Skin: Negative for pallor and wound.  Neurological: Negative for dizziness and headaches.    Physical Exam Updated Vital Signs BP (!) 139/110 (BP Location: Left Arm)   Pulse 78   Temp 98 F (36.7 C) (Oral)   Resp 16   LMP 03/12/2019   SpO2 100%   Physical Exam Vitals and nursing note reviewed.  Constitutional:      General: She is not in acute distress.    Appearance: She is well-developed. She is not diaphoretic.  HENT:     Head: Normocephalic and atraumatic.     Mouth/Throat:     Comments:  Patient initial lump concern is the actual joining of the palate together behind the front teeth.  She has another area that is adjacent to the left first molar.  Seems to be adjacent to the tooth.  No induration no fluctuance nontender.  She has some mild fullness of the area I expect to find the third molar though there is no eruption no erythema no drainage no fluctuance.  Her posterior oropharynx is without erythema no noted tonsillar swelling no exudates.  No anterior cervical lymphadenopathy. Eyes:     Pupils: Pupils are equal, round, and reactive to light.  Cardiovascular:     Rate and Rhythm: Normal rate and regular rhythm.     Heart sounds: No murmur. No friction rub. No gallop.   Pulmonary:      Effort: Pulmonary effort is normal.     Breath sounds: No wheezing or rales.  Abdominal:     General: There is no distension.     Palpations: Abdomen is soft.     Tenderness: There is no abdominal tenderness.  Musculoskeletal:        General: No tenderness.     Cervical back: Normal range of motion and neck supple.  Skin:    General: Skin is warm and dry.  Neurological:     Mental Status: She is alert and oriented to person, place, and time.  Psychiatric:        Behavior: Behavior normal.     ED Results / Procedures / Treatments   Labs (all labs ordered are listed, but only abnormal results are displayed) Labs Reviewed - No data to display  EKG None  Radiology No results found.  Procedures Procedures (including critical care time)  Medications Ordered in ED Medications - No data to display  ED Course  I have reviewed the triage vital signs and the nursing notes.  Pertinent labs & imaging results that were available during my care of the patient were reviewed by me and considered in my medical decision making (see chart for details).    MDM Rules/Calculators/A&P                      24 yo F with a chief complaints of dental pain.  She thinks that her wisdom teeth are coming in.  Having pain to those areas.  She also has noticed to lumps inside of her mouth that she is concerned about.  Looked it up on Google and thought that it could be cancerous.  On exam patient has no concerning findings.  No signs of infection.  Given dental follow-up.  12:51 PM:  I have discussed the diagnosis/risks/treatment options with the patient and believe the pt to be eligible for discharge home to follow-up with PCP. We also discussed returning to the ED immediately if new or worsening sx occur. We discussed the sx which are most concerning (e.g., sudden worsening pain, fever, inability to tolerate by mouth) that necessitate immediate return. Medications administered to the patient  during their visit and any new prescriptions provided to the patient are listed below.  Medications given during this visit Medications - No data to display   The patient appears reasonably screen and/or stabilized for discharge and I doubt any other medical condition or other [facility] requiring further screening, evaluation, or treatment in the ED at this time prior to discharge.   Final Clinical Impression(s) / ED Diagnoses Final diagnoses:  Pain, dental    Rx / DC Orders ED Discharge  Orders    None       Melene Plan, DO 03/16/19 1251

## 2019-03-16 NOTE — Discharge Instructions (Signed)
Take 4 over the counter ibuprofen tablets 3 times a day or 2 over-the-counter naproxen tablets twice a day for pain. Also take tylenol 1000mg (2 extra strength) four times a day.    Please follow-up with a dentist.  It would be the ones will be able to take care of you for your wisdom teeth.  Please return for redness drainage if you have difficulty swallowing.

## 2019-03-26 ENCOUNTER — Encounter (HOSPITAL_COMMUNITY): Payer: Self-pay | Admitting: Emergency Medicine

## 2019-03-26 ENCOUNTER — Other Ambulatory Visit: Payer: Self-pay

## 2019-03-26 ENCOUNTER — Emergency Department (HOSPITAL_COMMUNITY)
Admission: EM | Admit: 2019-03-26 | Discharge: 2019-03-26 | Disposition: A | Discharge status: Home / Self Care | Payer: Medicaid Other | Attending: Emergency Medicine | Admitting: Emergency Medicine

## 2019-03-26 DIAGNOSIS — Z79899 Other long term (current) drug therapy: Secondary | ICD-10-CM | POA: Insufficient documentation

## 2019-03-26 DIAGNOSIS — J4521 Mild intermittent asthma with (acute) exacerbation: Secondary | ICD-10-CM | POA: Insufficient documentation

## 2019-03-26 MED ORDER — IPRATROPIUM BROMIDE 0.02 % IN SOLN: 0.5000 mg | Freq: Once | RESPIRATORY_TRACT | Status: DC

## 2019-03-26 MED ORDER — ALBUTEROL SULFATE (2.5 MG/3ML) 0.083% IN NEBU: 5.0000 mg | INHALATION_SOLUTION | Freq: Once | RESPIRATORY_TRACT | Status: DC

## 2019-03-26 MED ORDER — ALBUTEROL SULFATE HFA 108 (90 BASE) MCG/ACT IN AERS
2.0000 | INHALATION_SPRAY | Freq: Once | RESPIRATORY_TRACT | Status: AC
  Administered 2019-03-26: 06:00:00 2 via RESPIRATORY_TRACT
  Filled 2019-03-26: qty 6.7

## 2019-03-26 MED ORDER — ALBUTEROL SULFATE (2.5 MG/3ML) 0.083% IN NEBU: 5.0000 mg | INHALATION_SOLUTION | Freq: Four times a day (QID) | RESPIRATORY_TRACT | 12 refills | Status: DC | PRN

## 2019-03-26 NOTE — Discharge Instructions (Addendum)
Use your Albuterol as directed as needed. Please establish with a primary care physician for routine management of your asthma.  Return to the emergency room with any worsening symptoms or new concerns.

## 2019-03-26 NOTE — ED Provider Notes (Signed)
[place] DEPT Provider Note   CSN: [id number] Arrival date & time: 03/26/19  3710     History Chief Complaint  Patient presents with  . Asthma    Michele Jennings is a 24 y.o. female.  Patient to ED with SoB, chest tightness that she reports feels like her asthma. She is out of her inhaler and nebulizer medication. She reports having no PCP. No fever, nasal or chest congestion, sore throat, nausea, vomiting. Symptoms started yesterday and woke this morning. Patient is a nonsmoker.   The history is provided by the patient. No language interpreter was used.  Asthma Pertinent negatives include no chest pain and no abdominal pain.       Past Medical History:  Diagnosis Date  . Asthma   . Eczema   . Seasonal allergies     There are no problems to display for this patient.   Past Surgical History:  Procedure Laterality Date  . lump removed from left leg.       OB History   No obstetric history on file.     History reviewed. No pertinent family history.  Social History   Tobacco Use  . Smoking status: Never Smoker  . Smokeless tobacco: Never Used  Substance Use Topics  . Alcohol use: No  . Drug use: No    Home Medications Prior to Admission medications   Medication Sig Start Date End Date Taking? Authorizing Provider  albuterol (PROVENTIL HFA;VENTOLIN HFA) 108 (90 Base) MCG/ACT inhaler Inhale 1-2 puffs into the lungs every 6 (six) hours as needed for wheezing. 11/08/16   Tanna Furry, MD  beclomethasone (QVAR) 40 MCG/ACT inhaler Inhale 2 puffs into the lungs 2 (two) times daily. 08/29/16   Charlann Lange, PA-C  ibuprofen (ADVIL,MOTRIN) 800 MG tablet Take 1 tablet (800 mg total) by mouth every 8 (eight) hours as needed. 03/23/18   Long, Wonda Olds, MD    Allergies    Dairy aid [lactase] and Shellfish allergy  Review of Systems   Review of Systems  Constitutional: Negative for fever.  HENT: Negative for congestion and sore  throat.   Respiratory: Positive for chest tightness and wheezing. Negative for cough.   Cardiovascular: Negative for chest pain.  Gastrointestinal: Negative for abdominal pain and vomiting.  Musculoskeletal: Negative for myalgias.  Neurological: Negative for light-headedness.    Physical Exam Updated Vital Signs BP (!) 132/94 (BP Location: Left Arm)   Pulse 82   Temp 98 F (36.7 C) (Oral)   Resp 15   Ht 5' (1.524 m)   Wt 86.2 kg   LMP 03/18/2019   SpO2 100%   BMI 37.11 kg/m   Physical Exam Vitals and nursing note reviewed.  HENT:     Nose: Nose normal.     Mouth/Throat:     Mouth: Mucous membranes are moist.  Cardiovascular:     Rate and Rhythm: Normal rate and regular rhythm.     Heart sounds: No murmur.  Pulmonary:     Effort: Pulmonary effort is normal. No respiratory distress.     Breath sounds: No wheezing, rhonchi or rales.  Musculoskeletal:        General: Normal range of motion.  Skin:    General: Skin is warm and dry.  Neurological:     Mental Status: She is oriented to person, place, and time.     ED Results / Procedures / Treatments   Labs (all labs ordered are listed, but only abnormal results are  displayed) Labs Reviewed - No data to display  EKG None  Radiology No results found.  Procedures Procedures (including critical care time)  Medications Ordered in ED Medications  albuterol (PROVENTIL) (2.5 MG/3ML) 0.083% nebulizer solution 5 mg (has no administration in time range)  ipratropium (ATROVENT) nebulizer solution 0.5 mg (has no administration in time range)    ED Course  I have reviewed the triage vital signs and the nursing notes.  Pertinent labs & imaging results that were available during my care of the patient were reviewed by me and considered in my medical decision making (see chart for details).    MDM Rules/Calculators/A&P                      Patient to ED with c/o wheezing, chest tightness, "feels like my asthma". No  fever or other signs of acute illness.   She is very well appearing, no wheezing on exam. Normal O2 saturation. Albuterol inhaler ordered and will reassess. No fever - CXR not felt indicated.   On recheck the patient reports feeling significantly improved. Will d/ch with inhaler and Rx nebulizer Albuterol. Will provide referral for PCP.   Final Clinical Impression(s) / ED Diagnoses Final diagnoses:  None   1. Mild asthma   Rx / DC Orders ED Discharge Orders    None       Elpidio Anis, PA-C 03/26/19 0601    Shon Baton, MD 03/26/19 [phone]

## 2019-03-26 NOTE — ED Notes (Signed)
Duoneb ordered. Per protocol Leta Jungling, charge RN) we cannot give a nebulizer until we have a negative COVID test. Per PA, patient has no s/s of covid. Order changed to albuterol inhaler.

## 2019-03-26 NOTE — ED Triage Notes (Signed)
Pt reports increasing wheezing from asthma for the last 2 days. Pt denies having an inhaler.

## 2019-07-28 ENCOUNTER — Other Ambulatory Visit: Payer: Self-pay

## 2019-07-28 DIAGNOSIS — J45901 Unspecified asthma with (acute) exacerbation: Secondary | ICD-10-CM | POA: Insufficient documentation

## 2019-07-28 DIAGNOSIS — R05 Cough: Secondary | ICD-10-CM | POA: Insufficient documentation

## 2019-07-29 ENCOUNTER — Encounter (HOSPITAL_COMMUNITY): Payer: Self-pay | Admitting: Emergency Medicine

## 2019-07-29 ENCOUNTER — Emergency Department (HOSPITAL_COMMUNITY)
Admission: EM | Admit: 2019-07-29 | Discharge: 2019-07-29 | Disposition: A | Discharge status: Home / Self Care | Payer: Medicaid Other | Attending: Emergency Medicine | Admitting: Emergency Medicine

## 2019-07-29 ENCOUNTER — Emergency Department (HOSPITAL_COMMUNITY): Payer: Medicaid Other

## 2019-07-29 DIAGNOSIS — J45901 Unspecified asthma with (acute) exacerbation: Secondary | ICD-10-CM

## 2019-07-29 MED ORDER — ALBUTEROL SULFATE (2.5 MG/3ML) 0.083% IN NEBU: 2.5000 mg | INHALATION_SOLUTION | Freq: Four times a day (QID) | RESPIRATORY_TRACT | 12 refills | Status: DC | PRN

## 2019-07-29 MED ORDER — IPRATROPIUM-ALBUTEROL 0.5-2.5 (3) MG/3ML IN SOLN
3.0000 mL | Freq: Once | RESPIRATORY_TRACT | Status: AC
  Administered 2019-07-29: 3 mL via RESPIRATORY_TRACT
  Filled 2019-07-29: qty 3

## 2019-07-29 MED ORDER — ALBUTEROL SULFATE HFA 108 (90 BASE) MCG/ACT IN AERS
8.0000 | INHALATION_SPRAY | Freq: Once | RESPIRATORY_TRACT | Status: AC
  Administered 2019-07-29: 8 via RESPIRATORY_TRACT
  Filled 2019-07-29: qty 6.7

## 2019-07-29 NOTE — ED Provider Notes (Signed)
[facility] DEPT Provider Note   CSN: [id number] Arrival date & time: 07/28/19  2338     History Chief Complaint  Patient presents with  . Asthma    Michele Jennings is a 24 y.o. female with a history of asthma, seasonal allergies, and eczema who presents to the emergency department with a chief complaint of wheezing.  The patient reports that she has been wheezing, onset in the last 24 hours.  She states that she has also had some mild shortness of breath and a nonproductive cough.  Symptoms feel similar to prior asthma exacerbations and she is out of her home nebulizer refills as well as her home albuterol inhaler.  She does not take any maintenance medications and has not established with primary care.  She does not take OCPs.  She denies chest pain, fever, chills, loss of sense of taste or smell, nasal congestion, abdominal pain, nausea, vomiting, diarrhea, or rash.  No other treatment prior to arrival.  The history is provided by the patient. No language interpreter was used.       Past Medical History:  Diagnosis Date  . Asthma   . Eczema   . Seasonal allergies     There are no problems to display for this patient.   Past Surgical History:  Procedure Laterality Date  . lump removed from left leg.       OB History   No obstetric history on file.     History reviewed. No pertinent family history.  Social History   Tobacco Use  . Smoking status: Never Smoker  . Smokeless tobacco: Never Used  Substance Use Topics  . Alcohol use: No  . Drug use: No    Home Medications Prior to Admission medications   Medication Sig Start Date End Date Taking? Authorizing Provider  albuterol (PROVENTIL HFA;VENTOLIN HFA) 108 (90 Base) MCG/ACT inhaler Inhale 1-2 puffs into the lungs every 6 (six) hours as needed for wheezing. 11/08/16  Yes Tanna Furry, MD  beclomethasone (QVAR) 40 MCG/ACT inhaler Inhale 2 puffs into the lungs 2 (two) times daily.  08/29/16  Yes Upstill, Nehemiah Settle, PA-C  albuterol (PROVENTIL) (2.5 MG/3ML) 0.083% nebulizer solution Take 3 mLs (2.5 mg total) by nebulization every 6 (six) hours as needed for wheezing or shortness of breath. 07/29/19   Lita Flynn A, PA-C    Allergies    Lactase, Other, and Shellfish allergy  Review of Systems   Review of Systems  Constitutional: Negative for activity change, chills and fever.  HENT: Negative for congestion, sinus pressure, sinus pain and sore throat.   Respiratory: Positive for cough, shortness of breath and wheezing. Negative for stridor.   Cardiovascular: Negative for chest pain, palpitations and leg swelling.  Gastrointestinal: Negative for abdominal pain, blood in stool, diarrhea, nausea and vomiting.  Genitourinary: Negative for dysuria.  Musculoskeletal: Negative for back pain.  Skin: Negative for rash.  Allergic/Immunologic: Negative for immunocompromised state.  Neurological: Negative for seizures, syncope, speech difficulty, weakness and headaches.  Psychiatric/Behavioral: Negative for confusion.    Physical Exam Updated Vital Signs BP 128/79   Pulse 75   Temp 98.8 F (37.1 C)   Resp 20   LMP 07/29/2019   SpO2 100%   Physical Exam Vitals and nursing note reviewed.  Constitutional:      General: She is not in acute distress.    Appearance: She is not ill-appearing, toxic-appearing or diaphoretic.     Comments: Well-appearing.  No acute distress.  HENT:  Head: Normocephalic.  Eyes:     Conjunctiva/sclera: Conjunctivae normal.  Cardiovascular:     Rate and Rhythm: Normal rate and regular rhythm.     Pulses: Normal pulses.     Heart sounds: Normal heart sounds. No murmur heard.  No friction rub. No gallop.   Pulmonary:     Effort: Pulmonary effort is normal. No respiratory distress.     Breath sounds: No stridor. No wheezing or rales.     Comments: Faint scattered end expiratory wheezes.  Good air movement throughout.  Patient is able to speak  in complete, fluent sentences without increased work of breathing. Abdominal:     General: There is no distension.     Palpations: Abdomen is soft. There is no mass.     Tenderness: There is no abdominal tenderness. There is no right CVA tenderness, left CVA tenderness, guarding or rebound.     Hernia: No hernia is present.  Musculoskeletal:     Cervical back: Neck supple.     Right lower leg: No edema.     Left lower leg: No edema.  Skin:    General: Skin is warm.     Findings: No rash.  Neurological:     Mental Status: She is alert.  Psychiatric:        Behavior: Behavior normal.     ED Results / Procedures / Treatments   Labs (all labs ordered are listed, but only abnormal results are displayed) Labs Reviewed - No data to display  EKG None  Radiology DG Chest 2 View  Result Date: 07/29/2019 CLINICAL DATA:  Shortness of breath today. EXAM: CHEST - 2 VIEW COMPARISON:  11/08/2016 FINDINGS: The cardiomediastinal contours are normal. Mild hyperinflation. Pulmonary vasculature is normal. No consolidation, pleural effusion, or pneumothorax. No acute osseous abnormalities are seen. IMPRESSION: Mild hyperinflation, can be seen with asthma or bronchitis. Electronically Signed   By: Narda Rutherford M.D.   On: 07/29/2019 00:37    Procedures Procedures (including critical care time)  Medications Ordered in ED Medications  albuterol (VENTOLIN HFA) 108 (90 Base) MCG/ACT inhaler 8 puff (8 puffs Inhalation Provided for home use 07/29/19 0527)  ipratropium-albuterol (DUONEB) 0.5-2.5 (3) MG/3ML nebulizer solution 3 mL (3 mLs Nebulization Given 07/29/19 0526)    ED Course  I have reviewed the triage vital signs and the nursing notes.  Pertinent labs & imaging results that were available during my care of the patient were reviewed by me and considered in my medical decision making (see chart for details).    MDM Rules/Calculators/A&P                          25 year old female with a  history of asthma, eczema, and seasonal allergies presenting with wheezing, shortness of breath, and cough over the last 24 hours.  She is out of her home albuterol nebulizer and albuterol inhaler.  She states that symptoms feel consistent with previous asthma exacerbations.  She is not on maintenance medications and does not have a PCP.  Chest x-ray with mild hyperinflation.  This has been reviewed by me and there is no evidence of pleural effusion or consolidation.  Will order DuoNeb and replaced the patient's albuterol inhaler in the ER.  She is PERC negative.  Low suspicion for ACS, pneumonia, or COVID-19.  Patient was reviewed on pulse ox to maintain oxygen saturation of 99%.  She is hemodynamically stable and in no acute distress.  Safe for discharge  at this time and patient was strongly encouraged to get established with a PCP.  ER return precautions given.  She has been discharged with a prescription for her home albuterol nebulizer.  Final Clinical Impression(s) / ED Diagnoses Final diagnoses:  Exacerbation of asthma, unspecified asthma severity, unspecified whether persistent    Rx / DC Orders ED Discharge Orders         Ordered    albuterol (PROVENTIL) (2.5 MG/3ML) 0.083% nebulizer solution  Every 6 hours PRN     Discontinue  Reprint     07/29/19 0502           Daune Colgate A, PA-C 07/29/19 0847    Sabas Sous, MD 07/31/19 [phone]

## 2019-07-29 NOTE — Discharge Instructions (Signed)
Thank you for allowing me to care for you today in the Emergency Department.   I would strongly recommend getting established with a primary care provider for follow-up in the clinic.  This can help you avoid having to come to the ER.   Use 2 puffs of the albuterol inhaler every 4 hours as needed for shortness of breath.  Use it with your spacer to improve effectiveness of the medication.  You can also use 1 albuterol nebulizer treatment every 4 hours as needed for shortness of breath.  Given your history of asthma, I would strongly consider getting the COVID-19 vaccination.  Return to the emergency department if you develop worsening trouble breathing, if you pass out, if your fingers or lips turn blue, or if you develop other new, concerning symptoms.

## 2019-07-29 NOTE — ED Notes (Signed)
Pt ambulated down the hallway and back. Oxygen stats stayed at 99% during ambulation. Pt did not verbalize or show any signs of SOB.

## 2020-04-19 ENCOUNTER — Emergency Department (HOSPITAL_BASED_OUTPATIENT_CLINIC_OR_DEPARTMENT_OTHER)
Admission: EM | Admit: 2020-04-19 | Discharge: 2020-04-20 | Disposition: A | Discharge status: Home / Self Care | Payer: Medicaid Other | Attending: Emergency Medicine | Admitting: Emergency Medicine

## 2020-04-19 ENCOUNTER — Encounter (HOSPITAL_BASED_OUTPATIENT_CLINIC_OR_DEPARTMENT_OTHER): Payer: Self-pay

## 2020-04-19 ENCOUNTER — Other Ambulatory Visit: Payer: Self-pay

## 2020-04-19 DIAGNOSIS — Z7951 Long term (current) use of inhaled steroids: Secondary | ICD-10-CM | POA: Insufficient documentation

## 2020-04-19 DIAGNOSIS — J45901 Unspecified asthma with (acute) exacerbation: Secondary | ICD-10-CM | POA: Insufficient documentation

## 2020-04-19 MED ORDER — ACETAMINOPHEN 325 MG PO TABS
650.0000 mg | ORAL_TABLET | Freq: Once | ORAL | Status: AC
  Administered 2020-04-19: 650 mg via ORAL
  Filled 2020-04-19: qty 2

## 2020-04-19 MED ORDER — ALBUTEROL SULFATE HFA 108 (90 BASE) MCG/ACT IN AERS
2.0000 | INHALATION_SPRAY | Freq: Once | RESPIRATORY_TRACT | Status: AC
  Administered 2020-04-19: 2 via RESPIRATORY_TRACT
  Filled 2020-04-19: qty 6.7

## 2020-04-19 MED ORDER — ALBUTEROL SULFATE (2.5 MG/3ML) 0.083% IN NEBU: 2.5000 mg | INHALATION_SOLUTION | Freq: Four times a day (QID) | RESPIRATORY_TRACT | 12 refills | Status: DC | PRN

## 2020-04-19 MED ORDER — PREDNISONE 20 MG PO TABS
40.0000 mg | ORAL_TABLET | Freq: Once | ORAL | Status: AC
  Administered 2020-04-19: 40 mg via ORAL
  Filled 2020-04-19: qty 2

## 2020-04-19 MED ORDER — PREDNISONE 20 MG PO TABS: 40.0000 mg | ORAL_TABLET | Freq: Every day | ORAL | 0 refills | Status: AC

## 2020-04-19 NOTE — ED Provider Notes (Signed)
[facility] EMERGENCY DEPARTMENT Provider Note   CSN: [id number] Arrival date & time: 04/19/20  2009     History Chief Complaint  Patient presents with  . Wheezing    Michele Jennings is a 25 y.o. female.  The history is provided by the patient.  Wheezing Severity:  Mild Onset quality:  Gradual Timing:  Intermittent Progression:  Waxing and waning Chronicity:  Recurrent Context comment:  Asthma symptoms and ran out of inhaler, negative covid test this week Relieved by:  Nothing Worsened by:  Nothing Associated symptoms: cough   Associated symptoms: no chest pain, no chest tightness, no ear pain, no fatigue, no fever, no rash, no shortness of breath, no sore throat and no sputum production        Past Medical History:  Diagnosis Date  . Asthma   . Eczema   . Seasonal allergies     There are no problems to display for this patient.   Past Surgical History:  Procedure Laterality Date  . lump removed from left leg.       OB History   No obstetric history on file.     No family history on file.  Social History   Tobacco Use  . Smoking status: Never Smoker  . Smokeless tobacco: Never Used  Substance Use Topics  . Alcohol use: No  . Drug use: No    Home Medications Prior to Admission medications   Medication Sig Start Date End Date Taking? Authorizing Provider  predniSONE (DELTASONE) 20 MG tablet Take 2 tablets (40 mg total) by mouth daily for 4 days. 04/19/20 04/23/20 Yes Tadarrius Burch, DO  albuterol (PROVENTIL HFA;VENTOLIN HFA) 108 (90 Base) MCG/ACT inhaler Inhale 1-2 puffs into the lungs every 6 (six) hours as needed for wheezing. 11/08/16   Rolland Porter, MD  albuterol (PROVENTIL) (2.5 MG/3ML) 0.083% nebulizer solution Take 3 mLs (2.5 mg total) by nebulization every 6 (six) hours as needed for wheezing or shortness of breath. 04/19/20   Robertson Colclough, DO  beclomethasone (QVAR) 40 MCG/ACT inhaler Inhale 2 puffs into the lungs 2 (two) times daily.  08/29/16   Elpidio Anis, PA-C    Allergies    Lactase, Other, and Shellfish allergy  Review of Systems   Review of Systems  Constitutional: Negative for chills, fatigue and fever.  HENT: Negative for ear pain and sore throat.   Eyes: Negative for pain and visual disturbance.  Respiratory: Positive for cough and wheezing. Negative for sputum production, chest tightness and shortness of breath.   Cardiovascular: Negative for chest pain and palpitations.  Gastrointestinal: Negative for abdominal pain and vomiting.  Genitourinary: Negative for dysuria and hematuria.  Musculoskeletal: Negative for arthralgias and back pain.  Skin: Negative for color change and rash.  Neurological: Negative for seizures and syncope.  All other systems reviewed and are negative.   Physical Exam Updated Vital Signs BP 129/90 (BP Location: Left Arm)   Pulse 83   Temp 98.2 F (36.8 C) (Oral)   Resp 18   Ht 5\' 1"  (1.549 m)   Wt 92.5 kg   LMP 04/10/2020   SpO2 98%   BMI 38.55 kg/m   Physical Exam Vitals and nursing note reviewed.  Constitutional:      General: She is not in acute distress.    Appearance: She is well-developed.  HENT:     Head: Normocephalic and atraumatic.     Mouth/Throat:     Mouth: Mucous membranes are moist.  Eyes:  Extraocular Movements: Extraocular movements intact.     Conjunctiva/sclera: Conjunctivae normal.     Pupils: Pupils are equal, round, and reactive to light.  Cardiovascular:     Rate and Rhythm: Normal rate and regular rhythm.     Heart sounds: No murmur heard.   Pulmonary:     Effort: Pulmonary effort is normal. No respiratory distress.     Breath sounds: Wheezing present.  Abdominal:     Palpations: Abdomen is soft.     Tenderness: There is no abdominal tenderness.  Musculoskeletal:     Cervical back: Neck supple.  Skin:    General: Skin is warm and dry.  Neurological:     Mental Status: She is alert.     ED Results / Procedures /  Treatments   Labs (all labs ordered are listed, but only abnormal results are displayed) Labs Reviewed - No data to display  EKG None  Radiology No results found.  Procedures Procedures   Medications Ordered in ED Medications  acetaminophen (TYLENOL) tablet 650 mg (650 mg Oral Given 04/19/20 2332)  albuterol (VENTOLIN HFA) 108 (90 Base) MCG/ACT inhaler 2 puff (2 puffs Inhalation Given 04/19/20 2322)  predniSONE (DELTASONE) tablet 40 mg (40 mg Oral Given 04/19/20 2332)    ED Course  I have reviewed the triage vital signs and the nursing notes.  Pertinent labs & imaging results that were available during my care of the patient were reviewed by me and considered in my medical decision making (see chart for details).    MDM Rules/Calculators/A&P                          Michele Jennings is here with wheezing/asthma exacerbation.  History of asthma.  Normal vitals.  No fever.  Negative Covid test already this week.  Has mild wheezing throughout on exam but no respiratory distress.  Felt better after inhalers.  Denied any pregnancy concern.  Menstrual cycle last week.  Will give albuterol, prednisone.  Understands return precautions.  Discharged in good condition.  Overall suspect mild asthma exacerbation.  This chart was dictated using voice recognition software.  Despite best efforts to proofread,  errors can occur which can change the documentation meaning.   Final Clinical Impression(s) / ED Diagnoses Final diagnoses:  Mild asthma with exacerbation, unspecified whether persistent    Rx / DC Orders ED Discharge Orders         Ordered    albuterol (PROVENTIL) (2.5 MG/3ML) 0.083% nebulizer solution  Every 6 hours PRN        04/19/20 2340    predniSONE (DELTASONE) 20 MG tablet  Daily        04/19/20 2340           Michele Norfolk, DO 04/19/20 2342

## 2020-04-19 NOTE — ED Triage Notes (Signed)
Pt c/o wheezing x 2 days-states she has been out of her inhaler x 1 week-NAD-steady gait

## 2020-05-01 ENCOUNTER — Emergency Department (HOSPITAL_COMMUNITY)
Admission: EM | Admit: 2020-05-01 | Discharge: 2020-05-01 | Disposition: A | Discharge status: Home / Self Care | Payer: Medicaid Other | Attending: Emergency Medicine | Admitting: Emergency Medicine

## 2020-05-01 ENCOUNTER — Encounter (HOSPITAL_COMMUNITY): Payer: Self-pay

## 2020-05-01 ENCOUNTER — Other Ambulatory Visit: Payer: Self-pay

## 2020-05-01 DIAGNOSIS — J45909 Unspecified asthma, uncomplicated: Secondary | ICD-10-CM | POA: Insufficient documentation

## 2020-05-01 DIAGNOSIS — R5383 Other fatigue: Secondary | ICD-10-CM | POA: Insufficient documentation

## 2020-05-01 DIAGNOSIS — R590 Localized enlarged lymph nodes: Secondary | ICD-10-CM | POA: Insufficient documentation

## 2020-05-01 DIAGNOSIS — R6884 Jaw pain: Secondary | ICD-10-CM | POA: Insufficient documentation

## 2020-05-01 DIAGNOSIS — Z7951 Long term (current) use of inhaled steroids: Secondary | ICD-10-CM | POA: Insufficient documentation

## 2020-05-01 DIAGNOSIS — H9201 Otalgia, right ear: Secondary | ICD-10-CM | POA: Insufficient documentation

## 2020-05-01 DIAGNOSIS — R519 Headache, unspecified: Secondary | ICD-10-CM

## 2020-05-01 MED ORDER — AZITHROMYCIN 250 MG PO TABS
500.0000 mg | ORAL_TABLET | Freq: Once | ORAL | Status: AC
  Administered 2020-05-01: 500 mg via ORAL
  Filled 2020-05-01: qty 2

## 2020-05-01 MED ORDER — PREDNISONE 20 MG PO TABS: 40.0000 mg | ORAL_TABLET | Freq: Every day | ORAL | 0 refills | Status: DC

## 2020-05-01 MED ORDER — PREDNISONE 20 MG PO TABS
60.0000 mg | ORAL_TABLET | ORAL | Status: AC
  Administered 2020-05-01: 60 mg via ORAL
  Filled 2020-05-01: qty 3

## 2020-05-01 MED ORDER — AZITHROMYCIN 250 MG PO TABS: 250.0000 mg | ORAL_TABLET | Freq: Every day | ORAL | 0 refills | Status: AC

## 2020-05-01 NOTE — Discharge Instructions (Addendum)
As discussed, today's evaluation has been generally reassuring.  You do not have a fever, there is no evidence for an abscess, but there is some suspicion for dental issues and/or sinusitis contributing to your facial pain and swelling.  You are starting a course of antibiotics and steroids.  These medications should help your symptoms.  However, it is important you follow-up at our primary care clinic, and use the provided dental resources as well. Return here for concerning changes in your condition.

## 2020-05-01 NOTE — ED Provider Notes (Signed)
[facility]-EMERGENCY DEPT Provider Note   CSN: [id number] Arrival date & time: 05/01/20  1232     History Chief Complaint  Patient presents with  . Facial Pain    Michele Jennings is a 25 y.o. female.  HPI Patient presents concern of facial pain, right ear pain.  She states that she is generally well aside from history of asthma.  Now, for the past 3 to 5 weeks that she has had ongoing pain primarily in the maxilla, frontal region, right ear, jaw.  No fever, no vomiting, no persistent dyspnea.  Minimal relief with albuterol.  She has been seen and evaluated in the emergency department, notes that she has not followed up with primary care, nor with a clinic, or ENT. Today, with fatigue due to the persistency of her symptoms she presents for evaluation.    Past Medical History:  Diagnosis Date  . Asthma   . Eczema   . Seasonal allergies     There are no problems to display for this patient.   Past Surgical History:  Procedure Laterality Date  . lump removed from left leg.       OB History   No obstetric history on file.     History reviewed. No pertinent family history.  Social History   Tobacco Use  . Smoking status: Never Smoker  . Smokeless tobacco: Never Used  Substance Use Topics  . Alcohol use: No  . Drug use: No    Home Medications Prior to Admission medications   Medication Sig Start Date End Date Taking? Authorizing Provider  azithromycin (ZITHROMAX) 250 MG tablet Take 1 tablet (250 mg total) by mouth daily for 4 days. Take 1 every day until finished. 05/01/20 05/05/20 Yes Gerhard Munch, MD  predniSONE (DELTASONE) 20 MG tablet Take 2 tablets (40 mg total) by mouth daily with breakfast. For the next four days 05/01/20  Yes Gerhard Munch, MD  albuterol (PROVENTIL HFA;VENTOLIN HFA) 108 (90 Base) MCG/ACT inhaler Inhale 1-2 puffs into the lungs every 6 (six) hours as needed for wheezing. 11/08/16   Rolland Porter, MD  albuterol  (PROVENTIL) (2.5 MG/3ML) 0.083% nebulizer solution Take 3 mLs (2.5 mg total) by nebulization every 6 (six) hours as needed for wheezing or shortness of breath. 04/19/20   Curatolo, Adam, DO  beclomethasone (QVAR) 40 MCG/ACT inhaler Inhale 2 puffs into the lungs 2 (two) times daily. 08/29/16   Elpidio Anis, PA-C    Allergies    Lactase, Other, and Shellfish allergy  Review of Systems   Review of Systems  Constitutional:       Per HPI, otherwise negative  HENT:       Per HPI, otherwise negative  Respiratory:       Per HPI, otherwise negative  Cardiovascular:       Per HPI, otherwise negative  Gastrointestinal: Negative for vomiting.  Endocrine:       Negative aside from HPI  Genitourinary:       Neg aside from HPI   Musculoskeletal:       Per HPI, otherwise negative  Skin: Negative.   Neurological: Negative for syncope.    Physical Exam Updated Vital Signs BP 100/86 (BP Location: Left Arm)   Pulse 84   Temp 98.6 F (37 C) (Oral)   Resp 16   LMP 04/10/2020   SpO2 99%   Physical Exam Vitals and nursing note reviewed.  Constitutional:      General: She is not in acute distress.  Appearance: She is well-developed. She is not ill-appearing or diaphoretic.  HENT:     Head: Normocephalic and atraumatic.      Right Ear: Hearing, tympanic membrane and ear canal normal.     Left Ear: Hearing, tympanic membrane and ear canal normal.     Mouth/Throat:     Lips: No lesions.     Pharynx: No pharyngeal swelling, oropharyngeal exudate, posterior oropharyngeal erythema or uvula swelling.   Eyes:     Conjunctiva/sclera: Conjunctivae normal.  Neck:   Cardiovascular:     Rate and Rhythm: Normal rate and regular rhythm.  Pulmonary:     Effort: Pulmonary effort is normal. No respiratory distress.     Breath sounds: No stridor.  Abdominal:     General: There is no distension.  Skin:    General: Skin is warm and dry.  Neurological:     Mental Status: She is alert and  oriented to person, place, and time.     Cranial Nerves: No cranial nerve deficit.  Psychiatric:        Mood and Affect: Mood normal.        Behavior: Behavior normal.     ED Results / Procedures / Treatments   Labs (all labs ordered are listed, but only abnormal results are displayed) Labs Reviewed - No data to display  EKG None  Radiology No results found.  Procedures Procedures   Medications Ordered in ED Medications  predniSONE (DELTASONE) tablet 60 mg (has no administration in time range)  azithromycin (ZITHROMAX) tablet 500 mg (has no administration in time range)    ED Course  I have reviewed the triage vital signs and the nursing notes.  Pertinent labs & imaging results that were available during my care of the patient were reviewed by me and considered in my medical decision making (see chart for details).   Generally well young female presents with almost 1 month of facial discomfort.  With mild erythema in the dental region, but no obvious abscess, some suspicion for inflammatory/infectious etiology. No evidence for airway compromise, bacteremia, sepsis.  Patient encouraged to follow-up with our clinic for appropriate ongoing management, discharged in stable condition. Final Clinical Impression(s) / ED Diagnoses Final diagnoses:  Facial pain    Rx / DC Orders ED Discharge Orders         Ordered    azithromycin (ZITHROMAX) 250 MG tablet  Daily        05/01/20 1612    predniSONE (DELTASONE) 20 MG tablet  Daily with breakfast        05/01/20 1612           Gerhard Munch, MD 05/01/20 1615

## 2020-05-01 NOTE — ED Triage Notes (Signed)
Pt arrived via POV, c/o facial pain, mainly jaw, sinus and ear pain x1 month. Was seen for same a month ago with no answer.

## 2020-12-08 ENCOUNTER — Other Ambulatory Visit: Payer: Self-pay

## 2020-12-08 ENCOUNTER — Emergency Department (HOSPITAL_BASED_OUTPATIENT_CLINIC_OR_DEPARTMENT_OTHER)
Admission: EM | Admit: 2020-12-08 | Discharge: 2020-12-08 | Disposition: A | Discharge status: Home / Self Care | Payer: BC Managed Care – PPO | Attending: Emergency Medicine | Admitting: Emergency Medicine

## 2020-12-08 ENCOUNTER — Encounter (HOSPITAL_BASED_OUTPATIENT_CLINIC_OR_DEPARTMENT_OTHER): Payer: Self-pay

## 2020-12-08 DIAGNOSIS — J4521 Mild intermittent asthma with (acute) exacerbation: Secondary | ICD-10-CM | POA: Insufficient documentation

## 2020-12-08 DIAGNOSIS — Z7951 Long term (current) use of inhaled steroids: Secondary | ICD-10-CM | POA: Insufficient documentation

## 2020-12-08 DIAGNOSIS — R0602 Shortness of breath: Secondary | ICD-10-CM | POA: Diagnosis present

## 2020-12-08 MED ORDER — ALBUTEROL SULFATE HFA 108 (90 BASE) MCG/ACT IN AERS: 2.0000 | INHALATION_SPRAY | RESPIRATORY_TRACT | 2 refills | Status: DC | PRN

## 2020-12-08 MED ORDER — ALBUTEROL SULFATE HFA 108 (90 BASE) MCG/ACT IN AERS
2.0000 | INHALATION_SPRAY | RESPIRATORY_TRACT | Status: DC | PRN
  Administered 2020-12-08: 2 via RESPIRATORY_TRACT
  Filled 2020-12-08: qty 6.7

## 2020-12-08 MED ORDER — ALBUTEROL SULFATE (2.5 MG/3ML) 0.083% IN NEBU: 2.5000 mg | INHALATION_SOLUTION | Freq: Four times a day (QID) | RESPIRATORY_TRACT | 12 refills | Status: DC | PRN

## 2020-12-08 NOTE — ED Triage Notes (Addendum)
Pt c/o being woken up with chest tightness from sleep, states she has been out of asthma inhalers x 1 week and has been having SOB symptoms all day. Speaking in full and complete sentences, breathing even and unlabored. Denies cough, sore throat, fever or sick contacts. LS dim throughout.

## 2020-12-08 NOTE — ED Provider Notes (Signed)
[facility] EMERGENCY DEPARTMENT Provider Note   CSN: [id number] Arrival date & time: 12/08/20  0153     History Chief Complaint  Patient presents with   Shortness of Breath    Michele Jennings is a 25 y.o. female.  Patient presents to the emergency department for evaluation of asthma symptoms.  Patient reports a history of asthma, has run out of her albuterol inhaler.  She has not had it for a week.  In the last hour she has had increased wheezing and shortness of breath consistent with her normal asthma.  She has not had any fever, cough, URI symptoms.  No associated chest pain.      Past Medical History:  Diagnosis Date   Asthma    Eczema    Seasonal allergies     There are no problems to display for this patient.   Past Surgical History:  Procedure Laterality Date   lump removed from left leg.       OB History   No obstetric history on file.     History reviewed. No pertinent family history.  Social History   Tobacco Use   Smoking status: Never   Smokeless tobacco: Never  Vaping Use   Vaping Use: Never used  Substance Use Topics   Alcohol use: No   Drug use: No    Home Medications Prior to Admission medications   Medication Sig Start Date End Date Taking? Authorizing Provider  albuterol (PROVENTIL) (2.5 MG/3ML) 0.083% nebulizer solution Take 3 mLs (2.5 mg total) by nebulization every 6 (six) hours as needed for wheezing or shortness of breath. 12/08/20  Yes Ariella Voit, Canary Brim, MD  albuterol (VENTOLIN HFA) 108 (90 Base) MCG/ACT inhaler Inhale 2 puffs into the lungs every 4 (four) hours as needed for wheezing or shortness of breath. 12/08/20  Yes Lillianah Swartzentruber, Canary Brim, MD  albuterol (PROVENTIL HFA;VENTOLIN HFA) 108 (90 Base) MCG/ACT inhaler Inhale 1-2 puffs into the lungs every 6 (six) hours as needed for wheezing. 11/08/16   Rolland Porter, MD  albuterol (PROVENTIL) (2.5 MG/3ML) 0.083% nebulizer solution Take 3 mLs (2.5 mg total) by  nebulization every 6 (six) hours as needed for wheezing or shortness of breath. 04/19/20   Curatolo, Adam, DO  beclomethasone (QVAR) 40 MCG/ACT inhaler Inhale 2 puffs into the lungs 2 (two) times daily. 08/29/16   Elpidio Anis, PA-C  predniSONE (DELTASONE) 20 MG tablet Take 2 tablets (40 mg total) by mouth daily with breakfast. For the next four days 05/01/20   Gerhard Munch, MD    Allergies    Other, Shellfish allergy, and Tilactase  Review of Systems   Review of Systems  Respiratory:  Positive for shortness of breath and wheezing.   All other systems reviewed and are negative.  Physical Exam Updated Vital Signs BP (!) 150/97 (BP Location: Right Arm)   Pulse 96   Temp 97.9 F (36.6 C) (Tympanic)   Resp 16   Ht 5' (1.524 m)   Wt 95.3 kg   SpO2 100%   BMI 41.01 kg/m   Physical Exam Vitals and nursing note reviewed.  Constitutional:      General: She is not in acute distress.    Appearance: Normal appearance. She is well-developed.  HENT:     Head: Normocephalic and atraumatic.     Right Ear: Hearing normal.     Left Ear: Hearing normal.     Nose: Nose normal.  Eyes:     Conjunctiva/sclera: Conjunctivae normal.  Pupils: Pupils are equal, round, and reactive to light.  Cardiovascular:     Rate and Rhythm: Regular rhythm.     Heart sounds: S1 normal and S2 normal. No murmur heard.   No friction rub. No gallop.  Pulmonary:     Effort: Pulmonary effort is normal. No respiratory distress.     Breath sounds: Decreased breath sounds present.  Chest:     Chest wall: No tenderness.  Abdominal:     General: Bowel sounds are normal.     Palpations: Abdomen is soft.     Tenderness: There is no abdominal tenderness. There is no guarding or rebound. Negative signs include Murphy's sign and McBurney's sign.     Hernia: No hernia is present.  Musculoskeletal:        General: Normal range of motion.     Cervical back: Normal range of motion and neck supple.  Skin:     General: Skin is warm and dry.     Findings: No rash.  Neurological:     Mental Status: She is alert and oriented to person, place, and time.     GCS: GCS eye subscore is 4. GCS verbal subscore is 5. GCS motor subscore is 6.     Cranial Nerves: No cranial nerve deficit.     Sensory: No sensory deficit.     Coordination: Coordination normal.  Psychiatric:        Speech: Speech normal.        Behavior: Behavior normal.        Thought Content: Thought content normal.    ED Results / Procedures / Treatments   Labs (all labs ordered are listed, but only abnormal results are displayed) Labs Reviewed - No data to display  EKG EKG Interpretation  Date/Time:  Sunday December 08 2020 02:12:00 EDT Ventricular Rate:  90 PR Interval:  158 QRS Duration: 84 QT Interval:  358 QTC Calculation: 437 R Axis:   72 Text Interpretation: Normal sinus rhythm Normal ECG Confirmed by Zavion Sleight J ([id number]) on 12/08/2020 2:16:18 AM  Radiology No results found.  Procedures Procedures   Medications Ordered in ED Medications  albuterol (VENTOLIN HFA) 108 (90 Base) MCG/ACT inhaler 2 puff (has no administration in time range)    ED Course  I have reviewed the triage vital signs and the nursing notes.  Pertinent labs & imaging results that were available during my care of the patient were reviewed by me and considered in my medical decision making (see chart for details).    MDM Rules/Calculators/A&P                           Patient with decreased air movement but no significant wheezing.  She appears comfortable.  No respiratory distress.  Oxygen saturation 100% on room air.  No chest pain.  EKG is normal. Final Clinical Impression(s) / ED Diagnoses Final diagnoses:  Mild intermittent asthma with exacerbation    Rx / DC Orders ED Discharge Orders          Ordered    albuterol (VENTOLIN HFA) 108 (90 Base) MCG/ACT inhaler  Every 4 hours PRN        12/08/20 0327    albuterol  (PROVENTIL) (2.5 MG/3ML) 0.083% nebulizer solution  Every 6 hours PRN        10 /30/22 0327             05-31-1971, MD 12/08/20 [id number]

## 2020-12-24 DIAGNOSIS — R002 Palpitations: Secondary | ICD-10-CM | POA: Insufficient documentation

## 2020-12-24 DIAGNOSIS — R079 Chest pain, unspecified: Secondary | ICD-10-CM | POA: Insufficient documentation

## 2020-12-24 HISTORY — DX: Palpitations: R00.2

## 2020-12-24 HISTORY — DX: Chest pain, unspecified: R07.9

## 2021-02-06 ENCOUNTER — Emergency Department (HOSPITAL_BASED_OUTPATIENT_CLINIC_OR_DEPARTMENT_OTHER)
Admission: EM | Admit: 2021-02-06 | Discharge: 2021-02-06 | Disposition: A | Discharge status: Home / Self Care | Payer: BC Managed Care – PPO | Attending: Emergency Medicine | Admitting: Emergency Medicine

## 2021-02-06 ENCOUNTER — Encounter (HOSPITAL_BASED_OUTPATIENT_CLINIC_OR_DEPARTMENT_OTHER): Payer: Self-pay

## 2021-02-06 ENCOUNTER — Emergency Department (HOSPITAL_BASED_OUTPATIENT_CLINIC_OR_DEPARTMENT_OTHER): Payer: BC Managed Care – PPO

## 2021-02-06 ENCOUNTER — Other Ambulatory Visit: Payer: Self-pay

## 2021-02-06 DIAGNOSIS — Z7951 Long term (current) use of inhaled steroids: Secondary | ICD-10-CM | POA: Insufficient documentation

## 2021-02-06 DIAGNOSIS — R0602 Shortness of breath: Secondary | ICD-10-CM | POA: Insufficient documentation

## 2021-02-06 DIAGNOSIS — J45909 Unspecified asthma, uncomplicated: Secondary | ICD-10-CM | POA: Insufficient documentation

## 2021-02-06 DIAGNOSIS — J45901 Unspecified asthma with (acute) exacerbation: Secondary | ICD-10-CM | POA: Diagnosis not present

## 2021-02-06 DIAGNOSIS — Z20822 Contact with and (suspected) exposure to covid-19: Secondary | ICD-10-CM | POA: Insufficient documentation

## 2021-02-06 LAB — BASIC METABOLIC PANEL
Anion gap: 8 (ref 5–15)
BUN: 15 mg/dL (ref 6–20)
CO2: 24 mmol/L (ref 22–32)
Calcium: 9.1 mg/dL (ref 8.9–10.3)
Chloride: 106 mmol/L (ref 98–111)
Creatinine, Ser: 0.92 mg/dL (ref 0.44–1.00)
GFR, Estimated: >60 mL/min (ref >60)
Glucose, Bld: 90 mg/dL (ref 70–99)
Potassium: 3.9 mmol/L (ref 3.5–5.1)
Sodium: 138 mmol/L (ref 135–145)

## 2021-02-06 LAB — CBC WITH DIFFERENTIAL/PLATELET
Abs Immature Granulocytes: 0.01 10*3/uL (ref 0.00–0.07)
Basophils Absolute: 0 10*3/uL (ref 0.0–0.1)
Basophils Relative: 1 %
Eosinophils Absolute: 0.2 10*3/uL (ref 0.0–0.5)
Eosinophils Relative: 4 %
HCT: 34.3 % — ABNORMAL LOW (ref 36.0–46.0)
Hemoglobin: 11.1 g/dL — ABNORMAL LOW (ref 12.0–15.0)
Immature Granulocytes: 0 %
Lymphocytes Relative: 41 %
Lymphs Abs: 2.3 10*3/uL (ref 0.7–4.0)
MCH: 28.6 pg (ref 26.0–34.0)
MCHC: 32.4 g/dL (ref 30.0–36.0)
MCV: 88.4 fL (ref 80.0–100.0)
Monocytes Absolute: 0.4 10*3/uL (ref 0.1–1.0)
Monocytes Relative: 8 %
Neutro Abs: 2.6 10*3/uL (ref 1.7–7.7)
Neutrophils Relative %: 46 %
Platelets: 211 10*3/uL (ref 150–400)
RBC: 3.88 MIL/uL (ref 3.87–5.11)
RDW: 13.1 % (ref 11.5–15.5)
WBC: 5.7 10*3/uL (ref 4.0–10.5)
nRBC: 0 % (ref 0.0–0.2)

## 2021-02-06 LAB — PREGNANCY, URINE: Preg Test, Ur: NEGATIVE

## 2021-02-06 LAB — RESP PANEL BY RT-PCR (FLU A&B, COVID) ARPGX2
Influenza A by PCR: NEGATIVE
Influenza B by PCR: NEGATIVE
SARS Coronavirus 2 by RT PCR: NEGATIVE

## 2021-02-06 MED ORDER — METHYLPREDNISOLONE SODIUM SUCC 125 MG IJ SOLR
125.0000 mg | Freq: Once | INTRAMUSCULAR | Status: AC
  Administered 2021-02-06: 10:00:00 125 mg via INTRAVENOUS
  Filled 2021-02-06: qty 2

## 2021-02-06 MED ORDER — PREDNISONE 20 MG PO TABS: 40.0000 mg | ORAL_TABLET | Freq: Every day | ORAL | 0 refills | Status: DC

## 2021-02-06 MED ORDER — IPRATROPIUM-ALBUTEROL 0.5-2.5 (3) MG/3ML IN SOLN: 3.0000 mL | Freq: Once | RESPIRATORY_TRACT | Status: AC

## 2021-02-06 MED ORDER — SODIUM CHLORIDE 0.9 % IV BOLUS
1000.0000 mL | Freq: Once | INTRAVENOUS | Status: AC
  Administered 2021-02-06: 10:00:00 1000 mL via INTRAVENOUS

## 2021-02-06 MED ORDER — IPRATROPIUM-ALBUTEROL 0.5-2.5 (3) MG/3ML IN SOLN
RESPIRATORY_TRACT | Status: AC
  Administered 2021-02-06: 09:00:00 3 mL
  Filled 2021-02-06: qty 3

## 2021-02-06 MED ORDER — ALBUTEROL SULFATE HFA 108 (90 BASE) MCG/ACT IN AERS: 1.0000 | INHALATION_SPRAY | Freq: Four times a day (QID) | RESPIRATORY_TRACT | 0 refills | Status: DC | PRN

## 2021-02-06 NOTE — ED Triage Notes (Signed)
Pt seen here today for same , cont SOB , RT to triage for eval

## 2021-02-06 NOTE — Discharge Instructions (Signed)
You were seen in the emergency department for increased shortness of breath and chest tightness.  Your chest x-ray COVID and flu testing and lab work that did not show any significant abnormalities.  Your symptoms improved with some breathing treatments and steroids.  Please take prednisone for 4 more days.  Albuterol 2 puffs every 4 hours as needed.  Return to the emergency department if any worsening or concerning symptoms

## 2021-02-06 NOTE — ED Provider Notes (Signed)
[facility] EMERGENCY DEPARTMENT Provider Note   CSN: [id number] Arrival date & time: 02/06/21  [id number]     History Chief Complaint  Patient presents with   Shortness of Breath    Michele Jennings is a 25 y.o. female.  She has a history of asthma.  Complaining of increased shortness of breath over the last few days.  Cough productive of some clear sputum.  Has been using albuterol inhaler.  Shortness of breath acutely worsened today for the last 30 minutes.  Inhaler not helping.  No fevers or chills.  Nausea, no vomiting or diarrhea. No smoking  The history is provided by the patient.  Shortness of Breath Severity:  Moderate Onset quality:  Sudden Duration:  30 minutes Timing:  Constant Progression:  Unchanged Chronicity:  Recurrent Relieved by:  Nothing Worsened by:  Activity Ineffective treatments:  Inhaler Associated symptoms: chest pain, cough and sputum production   Associated symptoms: no abdominal pain, no fever, no headaches, no hemoptysis, no neck pain, no rash, no sore throat and no vomiting   Risk factors: no tobacco use       Past Medical History:  Diagnosis Date   Asthma    Eczema    Seasonal allergies     There are no problems to display for this patient.   Past Surgical History:  Procedure Laterality Date   lump removed from left leg.       OB History   No obstetric history on file.     History reviewed. No pertinent family history.  Social History   Tobacco Use   Smoking status: Never   Smokeless tobacco: Never  Vaping Use   Vaping Use: Never used  Substance Use Topics   Alcohol use: No   Drug use: No    Home Medications Prior to Admission medications   Medication Sig Start Date End Date Taking? Authorizing Provider  albuterol (PROVENTIL HFA;VENTOLIN HFA) 108 (90 Base) MCG/ACT inhaler Inhale 1-2 puffs into the lungs every 6 (six) hours as needed for wheezing. 11/08/16   Tanna Furry, MD  albuterol (PROVENTIL) (2.5 MG/3ML)  0.083% nebulizer solution Take 3 mLs (2.5 mg total) by nebulization every 6 (six) hours as needed for wheezing or shortness of breath. 04/19/20   Curatolo, Adam, DO  albuterol (PROVENTIL) (2.5 MG/3ML) 0.083% nebulizer solution Take 3 mLs (2.5 mg total) by nebulization every 6 (six) hours as needed for wheezing or shortness of breath. 12/08/20   Orpah Greek, MD  albuterol (VENTOLIN HFA) 108 (90 Base) MCG/ACT inhaler Inhale 2 puffs into the lungs every 4 (four) hours as needed for wheezing or shortness of breath. 12/08/20   Orpah Greek, MD  beclomethasone (QVAR) 40 MCG/ACT inhaler Inhale 2 puffs into the lungs 2 (two) times daily. 08/29/16   Charlann Lange, PA-C  predniSONE (DELTASONE) 20 MG tablet Take 2 tablets (40 mg total) by mouth daily with breakfast. For the next four days 05/01/20   Carmin Muskrat, MD    Allergies    Other, Shellfish allergy, and Tilactase  Review of Systems   Review of Systems  Constitutional:  Negative for fever.  HENT:  Negative for sore throat.   Eyes:  Negative for visual disturbance.  Respiratory:  Positive for cough, sputum production and shortness of breath. Negative for hemoptysis.   Cardiovascular:  Positive for chest pain.  Gastrointestinal:  Negative for abdominal pain and vomiting.  Genitourinary:  Negative for dysuria.  Musculoskeletal:  Negative for neck pain.  Skin:  Negative for rash.  Neurological:  Negative for headaches.   Physical Exam Updated Vital Signs BP (!) 135/112 (BP Location: Right Arm)    Pulse (!) 119    Temp 98.6 F (37 C) (Oral)    Resp (!) 22    Ht 5' (1.524 m)    Wt 90.7 kg    LMP  (LMP Unknown) Comment: states thinks it was earlier last month   SpO2 98%    BMI 39.06 kg/m   Physical Exam Vitals and nursing note reviewed.  Constitutional:      General: She is not in acute distress.    Appearance: Normal appearance. She is well-developed.  HENT:     Head: Normocephalic and atraumatic.  Eyes:      Conjunctiva/sclera: Conjunctivae normal.  Cardiovascular:     Rate and Rhythm: Regular rhythm. Tachycardia present.     Heart sounds: No murmur heard. Pulmonary:     Effort: Tachypnea and accessory muscle usage present. No respiratory distress.     Breath sounds: Wheezing present.  Abdominal:     Palpations: Abdomen is soft.     Tenderness: There is no abdominal tenderness.  Musculoskeletal:        General: No swelling.     Cervical back: Neck supple.     Right lower leg: No tenderness. No edema.     Left lower leg: No tenderness. No edema.  Skin:    General: Skin is warm and dry.     Capillary Refill: Capillary refill takes less than 2 seconds.  Neurological:     General: No focal deficit present.     Mental Status: She is alert.  Psychiatric:        Mood and Affect: Mood normal.    ED Results / Procedures / Treatments   Labs (all labs ordered are listed, but only abnormal results are displayed) Labs Reviewed  CBC WITH DIFFERENTIAL/PLATELET - Abnormal; Notable for the following components:      Result Value   Hemoglobin 11.1 (*)    HCT 34.3 (*)    All other components within normal limits  RESP PANEL BY RT-PCR (FLU A&B, COVID) [id number]  BASIC METABOLIC PANEL  PREGNANCY, URINE    EKG EKG Interpretation  Date/Time:  Thursday February 06 2021 09:19:52 EST Ventricular Rate:  119 PR Interval:  145 QRS Duration: 87 QT Interval:  319 QTC Calculation: 449 R Axis:   75 Text Interpretation: Sinus tachycardia Consider right atrial enlargement Borderline Q waves in lateral leads Borderline T wave abnormalities increased rate from prior 10/22 Confirmed by Aletta Edouard [phone]) on 02/06/2021 9:22:31 AM  Radiology DG Chest Port 1 View  Result Date: 02/06/2021 CLINICAL DATA:  Shortness of breath. EXAM: PORTABLE CHEST 1 VIEW COMPARISON:  07/29/2019 FINDINGS: The heart size and mediastinal contours are within normal limits. Both lungs are clear. The visualized skeletal  structures are unremarkable. IMPRESSION: No active disease. Electronically Signed   By: Kerby Moors M.D.   On: 02/06/2021 09:49    Procedures Procedures   Medications Ordered in ED Medications  ipratropium-albuterol (DUONEB) 0.5-2.5 (3) MG/3ML nebulizer solution 3 mL (has no administration in time range)  methylPREDNISolone sodium succinate (SOLU-MEDROL) 125 mg/2 mL injection 125 mg (has no administration in time range)  sodium chloride 0.9 % bolus 1,000 mL (has no administration in time range)    ED Course  I have reviewed the triage vital signs and the nursing notes.  Pertinent labs & imaging results that were available during my  care of the patient were reviewed by me and considered in my medical decision making (see chart for details).  Clinical Course as of 02/06/21 1738  Thu Feb 06, 2021  1158 Reassessed patient after breathing treatments.  She is feeling better.  Heart rates come down and oxygen is stayed stable.  We will send home with prescription for prednisone and she has inhalers.  Return instructions discussed [MB]    Clinical Course User Index [MB] Terrilee Files, MD   MDM Rules/Calculators/A&P                         Michele Jennings was evaluated in Emergency Department on 02/06/2021 for the symptoms described in the history of present illness. She was evaluated in the context of the global COVID-19 pandemic, which necessitated consideration that the patient might be at risk for infection with the SARS-CoV-2 virus that causes COVID-19. Institutional protocols and algorithms that pertain to the evaluation of patients at risk for COVID-19 are in a state of rapid change based on information released by regulatory bodies including the CDC and federal and state organizations. These policies and algorithms were followed during the patient's care in the ED.  This patient complains of shortness of breath tachycardia; this involves an extensive number of treatment Options and  is a complaint that carries with it a high risk of complications and Morbidity. The differential includes, PE, pneumothorax, asthma exacerbation COVID, flu, dehydration, anemia  I ordered, reviewed and interpreted labs, which included CBC with normal white count, hemoglobin stable from priors, chemistries normal, COVID and flu negative, urine pregnancy test negative I ordered medication IV steroids fluids, DuoNeb I ordered imaging studies which included chest x-ray and I independently    visualized and interpreted imaging which showed no infiltrate  Previous records obtained and reviewed in epic, has had other visits for asthma exacerbation  After the interventions stated above, I reevaluated the patient and found patient be satting well on room air with normal respiratory rate.  Tachycardia resolved.  Reviewed results of work-up with her and she is comfortable plan for outpatient management of asthma exacerbation.  Return instructions discussed     Final Clinical Impression(s) / ED Diagnoses Final diagnoses:  Moderate asthma with exacerbation, unspecified whether persistent    Rx / DC Orders ED Discharge Orders          Ordered    predniSONE (DELTASONE) 20 MG tablet  Daily with breakfast        02/06/21 1200    albuterol (VENTOLIN HFA) 108 (90 Base) MCG/ACT inhaler  Every 6 hours PRN        02/06/21 1210             Terrilee Files, MD 02/06/21 1740

## 2021-02-06 NOTE — ED Triage Notes (Signed)
Pt c/o shortness of breath since this morning. Hx of asthma, last took albuterol inhaler 30 mins pta. C/o chest tightness. States has been coughing up clear phlegm the past few days.

## 2021-02-06 NOTE — ED Notes (Signed)
Ambulated form lobby to room 7, SpO2 98-100, HR 136-150, + DOE, BBS distant faint exp wheezes t/o

## 2021-02-07 ENCOUNTER — Other Ambulatory Visit: Payer: Self-pay

## 2021-02-07 ENCOUNTER — Emergency Department (HOSPITAL_BASED_OUTPATIENT_CLINIC_OR_DEPARTMENT_OTHER)
Admission: EM | Admit: 2021-02-07 | Discharge: 2021-02-07 | Disposition: A | Discharge status: Home / Self Care | Payer: BC Managed Care – PPO | Source: Home / Self Care | Attending: Emergency Medicine | Admitting: Emergency Medicine

## 2021-02-07 ENCOUNTER — Encounter (HOSPITAL_BASED_OUTPATIENT_CLINIC_OR_DEPARTMENT_OTHER): Payer: Self-pay | Admitting: *Deleted

## 2021-02-07 DIAGNOSIS — R0602 Shortness of breath: Secondary | ICD-10-CM

## 2021-02-07 MED ORDER — ALBUTEROL SULFATE (2.5 MG/3ML) 0.083% IN NEBU: 2.5000 mg | INHALATION_SOLUTION | RESPIRATORY_TRACT | 12 refills | Status: DC | PRN

## 2021-02-07 NOTE — ED Provider Notes (Signed)
[facility] Provider Note: Lowella Dell, MD, FACEP  CSN: [id number] MRN: [id number] ARRIVAL: 02/06/21 at 2332 ROOM: [facility]09/[facility]09   CHIEF COMPLAINT  Shortness of Breath   HISTORY OF PRESENT ILLNESS  02/07/21 2:35 AM Michele Jennings is a 25 y.o. female with a history of asthma.  She was seen in this department yesterday for shortness of breath.  She was treated with a DuoNeb treatment and Solu-Medrol and discharged.  She returns after using her inhaler yesterday evening about 9 PM.  This caused her breathing to get worse and made her tachycardic into the 150s.  She has never had this reaction to her inhaler in the past.  She is now feeling better.   Past Medical History:  Diagnosis Date   Asthma    Eczema    Seasonal allergies     Past Surgical History:  Procedure Laterality Date   lump removed from left leg.      No family history on file.  Social History   Tobacco Use   Smoking status: Never   Smokeless tobacco: Never  Vaping Use   Vaping Use: Never used  Substance Use Topics   Alcohol use: No   Drug use: No    Prior to Admission medications   Medication Sig Start Date End Date Taking? Authorizing Provider  albuterol (PROVENTIL) (2.5 MG/3ML) 0.083% nebulizer solution Take 3 mLs (2.5 mg total) by nebulization every 4 (four) hours as needed for wheezing or shortness of breath. 02/07/21   Chrishaun Sasso, MD  albuterol (VENTOLIN HFA) 108 (90 Base) MCG/ACT inhaler Inhale 1-2 puffs into the lungs every 6 (six) hours as needed for wheezing. 02/06/21   Terrilee Files, MD  predniSONE (DELTASONE) 20 MG tablet Take 2 tablets (40 mg total) by mouth daily with breakfast. For the next four days 02/06/21   Terrilee Files, MD    Allergies Other, Shellfish allergy, and Tilactase   REVIEW OF SYSTEMS  Negative except as noted here or in the History of Present Illness.   PHYSICAL EXAMINATION  Initial Vital Signs Blood pressure (!) 122/96, pulse (!) 115, resp.  rate 18, last menstrual period 01/08/2021, SpO2 100 %.  Examination General: Well-developed, high BMI female in no acute distress; appearance consistent with age of record HENT: normocephalic; atraumatic Eyes: Normal appearance Neck: supple Heart: regular rate and rhythm Lungs: clear to auscultation bilaterally Abdomen: soft; nondistended; nontender; bowel sounds present Extremities: No deformity; full range of motion; pulses normal Neurologic: Awake, alert and oriented; motor function intact in all extremities and symmetric; no facial droop Skin: Warm and dry Psychiatric: Normal mood and affect   RESULTS  Summary of this visit's results, reviewed and interpreted by myself:   EKG Interpretation  Date/Time:    Ventricular Rate:    PR Interval:    QRS Duration:   QT Interval:    QTC Calculation:   R Axis:     Text Interpretation:         Laboratory Studies: Results for orders placed or performed during the hospital encounter of 02/06/21 (from the past 24 hour(s))  Pregnancy, urine     Status: None   Collection Time: 02/06/21  9:20 AM  Result Value Ref Range   Preg Test, Ur NEGATIVE NEGATIVE  Resp Panel by RT-PCR (Flu A&B, Covid) Nasopharyngeal Swab     Status: None   Collection Time: 02/06/21  9:58 AM   Specimen: Nasopharyngeal Swab; Nasopharyngeal(NP) swabs in vial transport medium  Result Value Ref  Range   SARS Coronavirus 2 by RT PCR NEGATIVE NEGATIVE   Influenza A by PCR NEGATIVE NEGATIVE   Influenza B by PCR NEGATIVE NEGATIVE  Basic metabolic panel     Status: None   Collection Time: 02/06/21  9:58 AM  Result Value Ref Range   Sodium 138 135 - 145 mmol/L   Potassium 3.9 3.5 - 5.1 mmol/L   Chloride 106 98 - 111 mmol/L   CO2 24 22 - 32 mmol/L   Glucose, Bld 90 70 - 99 mg/dL   BUN 15 6 - 20 mg/dL   Creatinine, Ser 6.29 0.44 - 1.00 mg/dL   Calcium 9.1 8.9 - 52.8 mg/dL   GFR, Estimated >41 >32 mL/min   Anion gap 8 5 - 15  CBC with Differential/Platelet      Status: Abnormal   Collection Time: 02/06/21  9:58 AM  Result Value Ref Range   WBC 5.7 4.0 - 10.5 K/uL   RBC 3.88 3.87 - 5.11 MIL/uL   Hemoglobin 11.1 (L) 12.0 - 15.0 g/dL   HCT 44.0 (L) 10.2 - 72.5 %   MCV 88.4 80.0 - 100.0 fL   MCH 28.6 26.0 - 34.0 pg   MCHC 32.4 30.0 - 36.0 g/dL   RDW 36.6 44.0 - 34.7 %   Platelets 211 150 - 400 K/uL   nRBC 0.0 0.0 - 0.2 %   Neutrophils Relative % 46 %   Neutro Abs 2.6 1.7 - 7.7 K/uL   Lymphocytes Relative 41 %   Lymphs Abs 2.3 0.7 - 4.0 K/uL   Monocytes Relative 8 %   Monocytes Absolute 0.4 0.1 - 1.0 K/uL   Eosinophils Relative 4 %   Eosinophils Absolute 0.2 0.0 - 0.5 K/uL   Basophils Relative 1 %   Basophils Absolute 0.0 0.0 - 0.1 K/uL   Immature Granulocytes 0 %   Abs Immature Granulocytes 0.01 0.00 - 0.07 K/uL   Imaging Studies: DG Chest Port 1 View  Result Date: 02/06/2021 CLINICAL DATA:  Shortness of breath. EXAM: PORTABLE CHEST 1 VIEW COMPARISON:  07/29/2019 FINDINGS: The heart size and mediastinal contours are within normal limits. Both lungs are clear. The visualized skeletal structures are unremarkable. IMPRESSION: No active disease. Electronically Signed   By: Signa Kell M.D.   On: 02/06/2021 09:49    ED COURSE and MDM  Nursing notes, initial and subsequent vitals signs, including pulse oximetry, reviewed and interpreted by myself.  Vitals:   02/07/21 0003  BP: (!) 122/96  Pulse: (!) 115  Resp: 18  SpO2: 100%   Medications - No data to display  Lungs are clear at this time.  Patient given AeroChamber for use with her inhaler.  She is requesting a refill of her nebulizer solution.  PROCEDURES  Procedures   ED DIAGNOSES     ICD-10-CM   1. Shortness of breath  R06.02          Paula Libra, MD 02/07/21 [phone]

## 2021-02-21 ENCOUNTER — Emergency Department (HOSPITAL_COMMUNITY): Payer: BC Managed Care – PPO

## 2021-02-21 ENCOUNTER — Encounter (HOSPITAL_COMMUNITY): Payer: Self-pay | Admitting: *Deleted

## 2021-02-21 ENCOUNTER — Other Ambulatory Visit: Payer: Self-pay

## 2021-02-21 ENCOUNTER — Emergency Department (HOSPITAL_COMMUNITY)
Admission: EM | Admit: 2021-02-21 | Discharge: 2021-02-21 | Disposition: A | Discharge status: Home / Self Care | Payer: BC Managed Care – PPO | Attending: Student | Admitting: Student

## 2021-02-21 DIAGNOSIS — R002 Palpitations: Secondary | ICD-10-CM | POA: Insufficient documentation

## 2021-02-21 DIAGNOSIS — R079 Chest pain, unspecified: Secondary | ICD-10-CM | POA: Insufficient documentation

## 2021-02-21 DIAGNOSIS — Z5321 Procedure and treatment not carried out due to patient leaving prior to being seen by health care provider: Secondary | ICD-10-CM | POA: Diagnosis not present

## 2021-02-21 DIAGNOSIS — R0602 Shortness of breath: Secondary | ICD-10-CM | POA: Insufficient documentation

## 2021-02-21 LAB — CBC WITH DIFFERENTIAL/PLATELET
Abs Immature Granulocytes: 0.01 10*3/uL (ref 0.00–0.07)
Basophils Absolute: 0 10*3/uL (ref 0.0–0.1)
Basophils Relative: 1 %
Eosinophils Absolute: 0.1 10*3/uL (ref 0.0–0.5)
Eosinophils Relative: 2 %
HCT: 37.3 % (ref 36.0–46.0)
Hemoglobin: 11.4 g/dL — ABNORMAL LOW (ref 12.0–15.0)
Immature Granulocytes: 0 %
Lymphocytes Relative: 40 %
Lymphs Abs: 2.6 10*3/uL (ref 0.7–4.0)
MCH: 27.7 pg (ref 26.0–34.0)
MCHC: 30.6 g/dL (ref 30.0–36.0)
MCV: 90.5 fL (ref 80.0–100.0)
Monocytes Absolute: 0.5 10*3/uL (ref 0.1–1.0)
Monocytes Relative: 8 %
Neutro Abs: 3.2 10*3/uL (ref 1.7–7.7)
Neutrophils Relative %: 49 %
Platelets: 287 10*3/uL (ref 150–400)
RBC: 4.12 MIL/uL (ref 3.87–5.11)
RDW: 13 % (ref 11.5–15.5)
WBC: 6.4 10*3/uL (ref 4.0–10.5)
nRBC: 0 % (ref 0.0–0.2)

## 2021-02-21 LAB — BASIC METABOLIC PANEL
Anion gap: 6 (ref 5–15)
BUN: 15 mg/dL (ref 6–20)
CO2: 23 mmol/L (ref 22–32)
Calcium: 8.4 mg/dL — ABNORMAL LOW (ref 8.9–10.3)
Chloride: 107 mmol/L (ref 98–111)
Creatinine, Ser: 1 mg/dL (ref 0.44–1.00)
GFR, Estimated: >60 mL/min (ref >60)
Glucose, Bld: 89 mg/dL (ref 70–99)
Potassium: 3.6 mmol/L (ref 3.5–5.1)
Sodium: 136 mmol/L (ref 135–145)

## 2021-02-21 LAB — I-STAT BETA HCG BLOOD, ED (MC, WL, AP ONLY): I-stat hCG, quantitative: <5 m[IU]/mL (ref <5)

## 2021-02-21 LAB — TROPONIN I (HIGH SENSITIVITY)
Troponin I (High Sensitivity): <2 ng/L (ref <18)
Troponin I (High Sensitivity): <2 ng/L (ref <18)

## 2021-02-21 LAB — TSH: TSH: 2.055 u[IU]/mL (ref 0.350–4.500)

## 2021-02-21 NOTE — ED Notes (Signed)
Patient left, stated she was going to a Funeral ,she didn't want to miss.

## 2021-02-21 NOTE — ED Provider Triage Note (Signed)
Emergency Medicine Provider Triage Evaluation Note  Michele Jennings , a 26 y.o. female  was evaluated in triage.  Pt complains of chest pain and palpitations. Patient notes chest pain has been intermittent for the past 2 months. Describes chest pain as a sharp sensation. Denies SOB, nausea, and vomiting with the chest pain. No history of blood clots. She also endorses palpitations which she describes as her heart beating fast. She notes palpitations typically wake her from her sleep. She has been seen previously for palpitations. Has a stress test scheduled in February. History of asthma and obesity, no other medication conditions. She is not currently on birth control.   Review of Systems  Positive: CP, palpitations Negative: SOB  Physical Exam  BP (!) 139/93 (BP Location: Right Arm)    Pulse 92    Temp 98.2 F (36.8 C) (Oral)    Resp 18    LMP 02/09/2021 (Approximate)    SpO2 100%  Gen:   Awake, no distress   Resp:  Normal effort  MSK:   Moves extremities without difficulty  Other:    Medical Decision Making  Medically screening exam initiated at 7:46 AM.  Appropriate orders placed.  Michele Jennings was informed that the remainder of the evaluation will be completed by another provider, this initial triage assessment does not replace that evaluation, and the importance of remaining in the ED until their evaluation is complete.  Cardiac labs TSH   Michele Jennings, [place] 02/21/21 2633

## 2021-02-21 NOTE — ED Triage Notes (Signed)
Pt reports ongoing chest pain, radiating into left arm. Has been occurring for months but more frequent. Also reports palpitations. No distress noted.

## 2021-02-28 ENCOUNTER — Emergency Department (HOSPITAL_COMMUNITY)
Admission: EM | Admit: 2021-02-28 | Discharge: 2021-02-28 | Disposition: A | Discharge status: Home / Self Care | Payer: BC Managed Care – PPO | Attending: Emergency Medicine | Admitting: Emergency Medicine

## 2021-02-28 ENCOUNTER — Emergency Department (HOSPITAL_COMMUNITY): Payer: BC Managed Care – PPO

## 2021-02-28 ENCOUNTER — Encounter (HOSPITAL_COMMUNITY): Payer: Self-pay

## 2021-02-28 ENCOUNTER — Ambulatory Visit
Admission: EM | Admit: 2021-02-28 | Discharge: 2021-02-28 | Disposition: A | Discharge status: Home / Self Care | Payer: BC Managed Care – PPO

## 2021-02-28 ENCOUNTER — Other Ambulatory Visit: Payer: Self-pay

## 2021-02-28 DIAGNOSIS — R079 Chest pain, unspecified: Secondary | ICD-10-CM

## 2021-02-28 DIAGNOSIS — Z7952 Long term (current) use of systemic steroids: Secondary | ICD-10-CM | POA: Diagnosis not present

## 2021-02-28 DIAGNOSIS — R0789 Other chest pain: Secondary | ICD-10-CM | POA: Insufficient documentation

## 2021-02-28 DIAGNOSIS — Z79899 Other long term (current) drug therapy: Secondary | ICD-10-CM | POA: Diagnosis not present

## 2021-02-28 LAB — CBC
HCT: 36.8 % (ref 36.0–46.0)
Hemoglobin: 11.8 g/dL — ABNORMAL LOW (ref 12.0–15.0)
MCH: 28.8 pg (ref 26.0–34.0)
MCHC: 32.1 g/dL (ref 30.0–36.0)
MCV: 89.8 fL (ref 80.0–100.0)
Platelets: 265 10*3/uL (ref 150–400)
RBC: 4.1 MIL/uL (ref 3.87–5.11)
RDW: 13.4 % (ref 11.5–15.5)
WBC: 5.2 10*3/uL (ref 4.0–10.5)
nRBC: 0 % (ref 0.0–0.2)

## 2021-02-28 LAB — TROPONIN I (HIGH SENSITIVITY)
Troponin I (High Sensitivity): <2 ng/L (ref <18)
Troponin I (High Sensitivity): <2 ng/L (ref <18)

## 2021-02-28 LAB — I-STAT BETA HCG BLOOD, ED (MC, WL, AP ONLY): I-stat hCG, quantitative: <5 m[IU]/mL (ref <5)

## 2021-02-28 LAB — BASIC METABOLIC PANEL
Anion gap: 9 (ref 5–15)
BUN: 14 mg/dL (ref 6–20)
CO2: 22 mmol/L (ref 22–32)
Calcium: 8.9 mg/dL (ref 8.9–10.3)
Chloride: 104 mmol/L (ref 98–111)
Creatinine, Ser: 0.94 mg/dL (ref 0.44–1.00)
GFR, Estimated: >60 mL/min (ref >60)
Glucose, Bld: 80 mg/dL (ref 70–99)
Potassium: 4.3 mmol/L (ref 3.5–5.1)
Sodium: 135 mmol/L (ref 135–145)

## 2021-02-28 NOTE — Discharge Instructions (Signed)
As discussed, your evaluation today has been largely reassuring.  But, it is important that you monitor your condition carefully, and do not hesitate to return to the ED if you develop new, or concerning changes in your condition. ? ?Otherwise, please follow-up with your physician for appropriate ongoing care. ? ?

## 2021-02-28 NOTE — ED Provider Notes (Addendum)
[facility]-EMERGENCY DEPT Provider Note   CSN: [id number] Arrival date & time: 02/28/21  1039     History  Chief Complaint  Patient presents with   Chest Pain    Michele Jennings is a 26 y.o. female.  HPI Patient presents for the sixth time in 6 months, third time in 1 month for commendation of chest discomfort, dyspnea.  Pain is pressure-like, left-sided, with left arm heaviness.  When asked about differences compared to when she has been experience for the past month and a half she denies any substantial difference.  No interval fever, vomiting, syncope.  She does not smoke.  She has no history of early family cardiac death, no known heart disease for herself individually.  No clear alleviating or exacerbating factors.    Home Medications Prior to Admission medications   Medication Sig Start Date End Date Taking? Authorizing Provider  albuterol (PROVENTIL) (2.5 MG/3ML) 0.083% nebulizer solution Take 3 mLs (2.5 mg total) by nebulization every 4 (four) hours as needed for wheezing or shortness of breath. 02/07/21  Yes Molpus, John, MD  albuterol (VENTOLIN HFA) 108 (90 Base) MCG/ACT inhaler Inhale 1-2 puffs into the lungs every 6 (six) hours as needed for wheezing. 02/06/21  Yes Terrilee Files, MD  Multiple Vitamin (MULTIVITAMIN) tablet Take 1 tablet by mouth daily.   Yes [provider]  predniSONE (DELTASONE) 20 MG tablet Take 2 tablets (40 mg total) by mouth daily with breakfast. For the next four days Patient not taking: Reported on 02/28/2021 02/06/21   Terrilee Files, MD      Allergies    Shellfish allergy, Other, and Tilactase    Review of Systems   Review of Systems  Constitutional:        Per HPI, otherwise negative  HENT:         Per HPI, otherwise negative  Respiratory:         Per HPI, otherwise negative  Cardiovascular:        Per HPI, otherwise negative  Gastrointestinal:  Negative for vomiting.  Endocrine:       Negative  aside from HPI  Genitourinary:        Neg aside from HPI   Musculoskeletal:        Per HPI, otherwise negative  Skin: Negative.   Neurological:  Negative for syncope.   Physical Exam Updated Vital Signs BP 111/76    Pulse 80    Temp 98 F (36.7 C) (Oral)    Resp 13    Ht 5' (1.524 m)    Wt 88 kg    LMP 02/10/2021 (Approximate)    SpO2 99%    BMI 37.89 kg/m  Physical Exam Vitals and nursing note reviewed.  Constitutional:      General: She is not in acute distress.    Appearance: She is well-developed.  HENT:     Head: Normocephalic and atraumatic.  Eyes:     Conjunctiva/sclera: Conjunctivae normal.  Cardiovascular:     Rate and Rhythm: Normal rate and regular rhythm.  Pulmonary:     Effort: Pulmonary effort is normal. No respiratory distress.     Breath sounds: Normal breath sounds. No stridor.  Abdominal:     General: There is no distension.  Skin:    General: Skin is warm and dry.  Neurological:     Mental Status: She is alert and oriented to person, place, and time.     Cranial Nerves: No cranial nerve  deficit.    ED Results / Procedures / Treatments   Labs (all labs ordered are listed, but only abnormal results are displayed) Labs Reviewed  CBC - Abnormal; Notable for the following components:      Result Value   Hemoglobin 11.8 (*)    All other components within normal limits  BASIC METABOLIC PANEL  I-STAT BETA HCG BLOOD, ED ([facility], [facility], [facility] ONLY)  TROPONIN I (HIGH SENSITIVITY)  TROPONIN I (HIGH SENSITIVITY)    EKG EKG Interpretation  Date/Time:  Friday February 28 2021 10:50:38 EST Ventricular Rate:  78 PR Interval:  141 QRS Duration: 90 QT Interval:  370 QTC Calculation: 422 R Axis:   52 Text Interpretation: Sinus rhythm unremarkable ECG Confirmed by Gerhard Munch [id number]) on 02/28/2021 10:57:10 AM  Radiology No results found.  Procedures Procedures    Medications Ordered in ED Medications - No data to display  ED Course/ Medical Decision  Making/ A&P Review notable for urgent care visit earlier today at another facility, referral here after the patient described chest pain, dyspnea.  Patient's evaluation are reassuring, I have interpreted her x-ray from earlier this week, reassuring, labs and after being monitored for hours, cardiac 80s sinus normal Pulse ox 100% room air normal There is no evidence for atypical ACS, pneumonia, PE (PERC 0), pneumothorax, bacteremia, sepsis.  Patient appropriate for discharge with outpatient follow-up.  Admission considered, not indicated.       Final Clinical Impression(s) / ED Diagnoses Final diagnoses:  Atypical chest pain     Gerhard Munch, MD 02/28/21 1325    Gerhard Munch, MD 02/28/21 1326

## 2021-02-28 NOTE — ED Triage Notes (Signed)
Pt c/o sharp chest pain to left chest with associated "dizzy" sensation, left arm pain, jaw pain, states has been seen multiple times for this leading to multiple tests that are all negative. Has been seen in [place] and [place]. States has a cards referral in [facility] for feb but states this cannot wait and needs one in [place] sooner.

## 2021-02-28 NOTE — ED Provider Notes (Signed)
[facility]    CSN: [id number] Arrival date & time: 02/28/21  0934      History   Chief Complaint Chief Complaint  Patient presents with   Chest Pain    HPI Michele Jennings is a 26 y.o. female.   Patient presents today for evaluation of left sided chest pain, left arm numbness and tingling and the sensation of pressure to her left chest. She states she feels as if she is not getting oxygen fully to left lung. At times she does feel some pain in the area with deep breathing. She reports symptoms are different from her typical asthma exacerbations and shortness of breath is not the same. She has been prescribed steroids and has taken albuterol inhaler without significant relief. She has had multiple EKG, troponins, dimer, and CXR which were all WNL. She denies any lightheadedness and reports she was able to drive herself here today.   The history is provided by the patient.  Chest Pain Associated symptoms: no fever and no shortness of breath    Past Medical History:  Diagnosis Date   Asthma    Eczema    Seasonal allergies     There are no problems to display for this patient.   Past Surgical History:  Procedure Laterality Date   lump removed from left leg.      OB History   No obstetric history on file.      Home Medications    Prior to Admission medications   Medication Sig Start Date End Date Taking? Authorizing Provider  albuterol (PROVENTIL) (2.5 MG/3ML) 0.083% nebulizer solution Take 3 mLs (2.5 mg total) by nebulization every 4 (four) hours as needed for wheezing or shortness of breath. 02/07/21   Molpus, John, MD  albuterol (VENTOLIN HFA) 108 (90 Base) MCG/ACT inhaler Inhale 1-2 puffs into the lungs every 6 (six) hours as needed for wheezing. 02/06/21   Terrilee Files, MD  predniSONE (DELTASONE) 20 MG tablet Take 2 tablets (40 mg total) by mouth daily with breakfast. For the next four days 02/06/21   Terrilee Files, MD    Family  History History reviewed. No pertinent family history.  Social History Social History   Tobacco Use   Smoking status: Never   Smokeless tobacco: Never  Vaping Use   Vaping Use: Never used  Substance Use Topics   Alcohol use: No   Drug use: No     Allergies   Other, Shellfish allergy, and Tilactase   Review of Systems Review of Systems  Constitutional:  Negative for chills and fever.  Eyes:  Negative for discharge and redness.  Respiratory:  Positive for chest tightness. Negative for shortness of breath.   Cardiovascular:  Positive for chest pain.    Physical Exam Triage Vital Signs ED Triage Vitals [02/28/21 0947]  Enc Vitals Group     BP 138/90     Pulse Rate 82     Resp 18     Temp 98.1 F (36.7 C)     Temp Source Oral     SpO2 98 %     Weight      Height      Head Circumference      Peak Flow      Pain Score 0     Pain Loc      Pain Edu?      Excl. in GC?    No data found.  Updated Vital Signs BP 138/90 (BP Location: Left  Arm)    Pulse 82    Temp 98.1 F (36.7 C) (Oral)    Resp 18    LMP 02/09/2021 (Approximate)    SpO2 98%  \    Physical Exam Vitals and nursing note reviewed.  Constitutional:      General: She is not in acute distress.    Appearance: She is well-developed. She is not ill-appearing.  HENT:     Head: Normocephalic and atraumatic.  Eyes:     Conjunctiva/sclera: Conjunctivae normal.  Cardiovascular:     Rate and Rhythm: Normal rate and regular rhythm.     Heart sounds: Normal heart sounds. No murmur heard. Pulmonary:     Effort: Pulmonary effort is normal. No respiratory distress.     Breath sounds: Normal breath sounds. No wheezing, rhonchi or rales.  Neurological:     Mental Status: She is alert.     Comments: Grip strength 5/5 on right, 3/5 on left  Psychiatric:     Comments: Patient is tearful at times when discussing symptoms     UC Treatments / Results  Labs (all labs ordered are listed, but only abnormal results  are displayed) Labs Reviewed - No data to display  EKG   Radiology No results found.  Procedures Procedures (including critical care time)  Medications Ordered in UC Medications - No data to display  Initial Impression / Assessment and Plan / UC Course  I have reviewed the triage vital signs and the nursing notes.  Pertinent labs & imaging results that were available during my care of the patient were reviewed by me and considered in my medical decision making (see chart for details).    EKG in office with NSR. I recommended further evaluation in the ED as I feel patient may be a good candidate for CT at this point given lack of obvious etiology through other testing thus far. I did briefly discuss possibility of anxiety causing symptoms as well. Patient is agreeable to report to ED from office.   Final Clinical Impressions(s) / UC Diagnoses   Final diagnoses:  Chest pain, unspecified type   Discharge Instructions   None    ED Prescriptions   None    PDMP not reviewed this encounter.   Tomi Bamberger, PA-C 02/28/21 1026

## 2021-02-28 NOTE — ED Triage Notes (Addendum)
Patient c/o intermittent left chest pain that radiates into the jaw and left shoulder x 1 1/2 months. Patient also c/o intermittent SOB tht occurs with the chest pain. Patient also c/o constant left arm numbness x 1 1/2 months.  Patient seen at [facility] this AM and referred the patient to the ED.

## 2021-03-02 ENCOUNTER — Encounter (HOSPITAL_COMMUNITY): Payer: Self-pay | Admitting: Emergency Medicine

## 2021-03-02 ENCOUNTER — Emergency Department (HOSPITAL_COMMUNITY): Payer: BC Managed Care – PPO

## 2021-03-02 ENCOUNTER — Other Ambulatory Visit: Payer: Self-pay

## 2021-03-02 ENCOUNTER — Emergency Department (HOSPITAL_COMMUNITY)
Admission: EM | Admit: 2021-03-02 | Discharge: 2021-03-02 | Disposition: A | Discharge status: Home / Self Care | Payer: BC Managed Care – PPO | Attending: Emergency Medicine | Admitting: Emergency Medicine

## 2021-03-02 DIAGNOSIS — N9489 Other specified conditions associated with female genital organs and menstrual cycle: Secondary | ICD-10-CM | POA: Insufficient documentation

## 2021-03-02 DIAGNOSIS — K449 Diaphragmatic hernia without obstruction or gangrene: Secondary | ICD-10-CM | POA: Insufficient documentation

## 2021-03-02 DIAGNOSIS — R0789 Other chest pain: Secondary | ICD-10-CM

## 2021-03-02 DIAGNOSIS — J45909 Unspecified asthma, uncomplicated: Secondary | ICD-10-CM | POA: Insufficient documentation

## 2021-03-02 DIAGNOSIS — J209 Acute bronchitis, unspecified: Secondary | ICD-10-CM | POA: Insufficient documentation

## 2021-03-02 DIAGNOSIS — K219 Gastro-esophageal reflux disease without esophagitis: Secondary | ICD-10-CM | POA: Diagnosis not present

## 2021-03-02 LAB — CBC
HCT: 33.7 % — ABNORMAL LOW (ref 36.0–46.0)
Hemoglobin: 10.6 g/dL — ABNORMAL LOW (ref 12.0–15.0)
MCH: 28.3 pg (ref 26.0–34.0)
MCHC: 31.5 g/dL (ref 30.0–36.0)
MCV: 89.9 fL (ref 80.0–100.0)
Platelets: 248 10*3/uL (ref 150–400)
RBC: 3.75 MIL/uL — ABNORMAL LOW (ref 3.87–5.11)
RDW: 13.5 % (ref 11.5–15.5)
WBC: 5.3 10*3/uL (ref 4.0–10.5)
nRBC: 0 % (ref 0.0–0.2)

## 2021-03-02 LAB — I-STAT BETA HCG BLOOD, ED (MC, WL, AP ONLY): I-stat hCG, quantitative: <5 m[IU]/mL (ref <5)

## 2021-03-02 LAB — TROPONIN I (HIGH SENSITIVITY)
Troponin I (High Sensitivity): <2 ng/L (ref <18)
Troponin I (High Sensitivity): <2 ng/L (ref <18)

## 2021-03-02 LAB — BASIC METABOLIC PANEL
Anion gap: 8 (ref 5–15)
BUN: 11 mg/dL (ref 6–20)
CO2: 23 mmol/L (ref 22–32)
Calcium: 8.8 mg/dL — ABNORMAL LOW (ref 8.9–10.3)
Chloride: 107 mmol/L (ref 98–111)
Creatinine, Ser: 0.92 mg/dL (ref 0.44–1.00)
GFR, Estimated: >60 mL/min (ref >60)
Glucose, Bld: 94 mg/dL (ref 70–99)
Potassium: 3.7 mmol/L (ref 3.5–5.1)
Sodium: 138 mmol/L (ref 135–145)

## 2021-03-02 MED ORDER — PREDNISONE 20 MG PO TABS: 40.0000 mg | ORAL_TABLET | Freq: Every day | ORAL | 0 refills | Status: DC

## 2021-03-02 NOTE — Discharge Instructions (Addendum)
Please use Tylenol or ibuprofen for pain.  You may use 600 mg ibuprofen every 6 hours or 1000 mg of Tylenol every 6 hours.  You may choose to alternate between the 2.  This would be most effective.  Not to exceed 4 g of Tylenol within 24 hours.  Not to exceed 3200 mg ibuprofen 24 hours.  You can use Tums, over-the-counter Pepcid for acid reflux symptoms, I have attached some information on certain food choices that may help to decrease acid reflux symptoms.  I recommend that you follow-up with cardiologist as you have planned.  They may place a cardiac event monitor to see if they can track your periods of heart racing to see if they are associated with any heart arrhythmia that we have not seen while you are in the emergency department.  As we discussed I think you have some aspect of pleurisy, or chest pain related to coughing, as you report that you have been coughing for over a week.  Your lungs sound overall clear, but with your report of productive mucus you may benefit from a short course of oral steroids to help clear out the inflammation in your lungs.  As we discussed there is some risk factors with taking steroids including increased energy, insomnia, increased appetite, weight gain, mood swings.  Please return to the emergency department if you develop persistent chest pain, shortness of breath.  Otherwise please follow-up with your primary care provider, and cardiology as discussed.

## 2021-03-02 NOTE — ED Provider Notes (Signed)
[facility] Provider Note   CSN: [id number] Arrival date & time: 03/02/21  0008     History  Chief Complaint  Patient presents with   Chest Pain    Michele Jennings is a 26 y.o. female with a past medical history significant for asthma and's with complaint for waking up from sleep with heart racing, central, left-sided chest pain, 5-minute intervals of arm tingling, with one episode of radiation into jaw, as well as feeling of shortness of breath, persistent cough for the last few weeks with production of green sputum.  Patient has had multiple visits in the last few months including 4 visits in the last month alone for concerns of shortness of breath, chest pain.  She has no family history of early cardiac death, no known heart disease, does not smoke, no history of diabetes, hypertension, hyperlipidemia.  No history of stroke.  She has had no recent fever, vomiting, loss of consciousness.  Today she reports that her pain is worse with coughing, but also occurring with walking, at random.  When asked which she is most concerned about patient reports that she understands that she is likely not having a heart attack, but she does not know what is going on with her, concerned that it may be something with her lungs, where she may be having arrhythmias or heart palpitations.  Patient also reports that she was advised by physician at urgent care to receive a CT of the chest to further evaluate for possible lung disease with ongoing shortness of breath, history of asthma.   Chest Pain Associated symptoms: shortness of breath       Home Medications Prior to Admission medications   Medication Sig Start Date End Date Taking? Authorizing Provider  albuterol (PROVENTIL) (2.5 MG/3ML) 0.083% nebulizer solution Take 3 mLs (2.5 mg total) by nebulization every 4 (four) hours as needed for wheezing or shortness of breath. 02/07/21  Yes Molpus, John, MD  albuterol  (VENTOLIN HFA) 108 (90 Base) MCG/ACT inhaler Inhale 1-2 puffs into the lungs every 6 (six) hours as needed for wheezing. 02/06/21  Yes Hayden Rasmussen, MD  ibuprofen (ADVIL) 200 MG tablet Take 200 mg by mouth every 6 (six) hours as needed for headache or moderate pain.   Yes [provider]  Multiple Vitamin (MULTIVITAMIN) tablet Take 1 tablet by mouth daily.   Yes [provider]  predniSONE (DELTASONE) 20 MG tablet Take 2 tablets (40 mg total) by mouth daily. 03/02/21  Yes Leeta Grimme H, PA-C      Allergies    Shellfish allergy, Other, and Tilactase    Review of Systems   Review of Systems  Respiratory:  Positive for shortness of breath.   Cardiovascular:  Positive for chest pain.  All other systems reviewed and are negative.  Physical Exam Updated Vital Signs BP 121/81 (BP Location: Right Arm)    Pulse 74    Temp 98.7 F (37.1 C) (Oral)    Resp 16    LMP 02/10/2021 (Approximate) Comment: pt shielded   SpO2 100%  Physical Exam Vitals and nursing note reviewed.  Constitutional:      General: She is not in acute distress.    Appearance: Normal appearance.  HENT:     Head: Normocephalic and atraumatic.  Eyes:     General:        Right eye: No discharge.        Left eye: No discharge.  Cardiovascular:  Rate and Rhythm: Normal rate and regular rhythm.     Heart sounds: No murmur heard.   No friction rub. No gallop.     Comments: Normal rate and rhythm for duration of stay. Pulmonary:     Effort: Pulmonary effort is normal.     Breath sounds: Normal breath sounds.     Comments: Very minimal end expiratory wheezing with forced exhalation.  No wheezing at rest, no stridor, rhonchi, rales. Chest:     Comments: Some tenderness palpation of the chest wall. Abdominal:     General: Bowel sounds are normal.     Palpations: Abdomen is soft.  Skin:    General: Skin is warm and dry.     Capillary Refill: Capillary refill takes less than 2 seconds.   Neurological:     Mental Status: She is alert and oriented to person, place, and time.  Psychiatric:        Mood and Affect: Mood normal.        Behavior: Behavior normal.    ED Results / Procedures / Treatments   Labs (all labs ordered are listed, but only abnormal results are displayed) Labs Reviewed  BASIC METABOLIC PANEL - Abnormal; Notable for the following components:      Result Value   Calcium 8.8 (*)    All other components within normal limits  CBC - Abnormal; Notable for the following components:   RBC 3.75 (*)    Hemoglobin 10.6 (*)    HCT 33.7 (*)    All other components within normal limits  I-STAT BETA HCG BLOOD, ED ([facility], [facility], [facility] ONLY)  TROPONIN I (HIGH SENSITIVITY)  TROPONIN I (HIGH SENSITIVITY)    EKG None  Radiology DG Chest 2 View  Result Date: 03/02/2021 CLINICAL DATA:  Chest pain EXAM: CHEST - 2 VIEW COMPARISON:  02/25/2021 FINDINGS: The heart size and mediastinal contours are within normal limits. Both lungs are clear. The visualized skeletal structures are unremarkable. IMPRESSION: Normal study. Electronically Signed   By: Rolm Baptise M.D.   On: 03/02/2021 00:42   CT Chest Wo Contrast  Result Date: 03/02/2021 CLINICAL DATA:  Chronic dyspnea of unknown etiology. Sharp chest pain radiating into left arm for 2 weeks. EXAM: CT CHEST WITHOUT CONTRAST TECHNIQUE: Multidetector CT imaging of the chest was performed following the standard protocol without IV contrast. RADIATION DOSE REDUCTION: This exam was performed according to the departmental dose-optimization program which includes automated exposure control, adjustment of the mA and/or kV according to patient size and/or use of iterative reconstruction technique. COMPARISON:  None. FINDINGS: Cardiovascular: No significant vascular findings. Normal heart size. No pericardial effusion. Mediastinum/Nodes: No enlarged mediastinal or axillary lymph nodes. Thyroid gland, trachea, and esophagus demonstrate no  significant findings. Lungs/Pleura: No pleural effusion, airspace consolidation, or pneumothorax. No atelectasis identified no signs of interstitial lung disease. No suspicious lung nodules. Upper Abdomen: No acute findings. Small hiatal hernia identified, image 40/3. Musculoskeletal: No acute or suspicious osseous findings. No chest wall mass identified. IMPRESSION: 1. No active cardiopulmonary abnormalities. 2. Small hiatal hernia. Electronically Signed   By: Kerby Moors M.D.   On: 03/02/2021 12:22    Procedures Procedures    Medications Ordered in ED Medications - No data to display  ED Course/ Medical Decision Making/ A&P                           Medical Decision Making Amount and/or Complexity of Data Reviewed Labs: ordered.  Radiology: ordered.   Given the large differential diagnosis for Sharmon Revere, the decision making in this case is of high complexity.  After evaluating all of the data points in this case, the presentation of CYNNAMON KENTER is NOT consistent with Acute Coronary Syndrome (ACS) and/or myocardial ischemia, pulmonary embolism, aortic dissection; Borhaave's, significant arrythmia, pneumothorax, cardiac tamponade, or other emergent cardiopulmonary condition.  Further, the presentation of JANESSAH SHIPPER is NOT consistent with pericarditis, myocarditis, cholecystitis, pancreatitis, mediastinitis, endocarditis, new valvular disease.  Additionally, the presentation of TIAWANNA ANDREW is NOT consistent with flail chest, cardiac contusion, ARDS, or significant intra-thoracic or intra-abdominal bleeding.  Moreover, this presentation is NOT consistent with pneumonia, sepsis, or pyelonephritis.  The patient has multiple recent ED visits for atypical chest pain, of asthma, report of heart racing.  She does report that she has had cough productive of yellow, green mucus for the last week or 2.  Discussed with patient in context of her asthma, recent cough, presumptive  viral illness several weeks ago that she may have some lingering inflammation in her lungs consistent with acute bronchitis.  Discussed with patient I do not believe that she has any underlying in for structural reason for her atypical chest pain and her lungs, heart, however patient was recommended by her urgent care doctor to receive a CT of the chest.  In context of ongoing chest pain despite multiple emergency department visits, I do believe it is reasonable to perform CT chest to further elucidate for signs of subclinical pneumonia, interstitial lung changes, hiatal hernia versus other.  Patient has negative troponin x2, unremarkable chest x-ray, nonischemic EKG, which was independently reviewed by myself and my attending Dr. Alvino Chapel with an underlying rhythm of sinus, and has a heart score of 1.   I do recommend that she follow-up with cardiology for possible event monitor placement as she has continued complaint of frequent heart racing sensation.  We will treat with brief pulse steroids for presumed acute bronchitis, this decision was made with shared decision making, and patient fully informed of risks of steroid use.  I personally reviewed radiographic imaging including chest x-ray, CT chest without contrast.  CT chest shows hiatal hernia.  Discussed with patient that this is consistent with proclivity for GERD.  I agree with radiologist interpretation.  Strict return and follow-up precautions have been given by me personally or by detailed written instruction given verbally by nursing staff using the teach back method to the patient.  Data Reviewed/Counseling: I have reviewed the patient's vital signs, nursing notes, and other relevant tests/information. I had a detailed discussion regarding the historical points, exam findings, and any diagnostic results supporting the discharge diagnosis. I also discussed the need for outpatient follow-up and the need to return to the ED if symptoms worsen or  if there are any questions or concerns that arise at home.  Final Clinical Impression(s) / ED Diagnoses Final diagnoses:  Atypical chest pain  Hiatal hernia  Gastroesophageal reflux disease, unspecified whether esophagitis present  Acute bronchitis, unspecified organism    Rx / DC Orders ED Discharge Orders          Ordered    predniSONE (DELTASONE) 20 MG tablet  Daily        03/02/21 1234              Dorien Chihuahua 03/02/21 1239    Davonna Belling, MD 03/02/21 1526

## 2021-03-02 NOTE — ED Triage Notes (Signed)
Pt c/o sharp chest pain that radiates to her left arm x 2 weeks. Reports a cough that started today.

## 2021-03-06 ENCOUNTER — Emergency Department (HOSPITAL_COMMUNITY): Payer: BC Managed Care – PPO

## 2021-03-06 ENCOUNTER — Other Ambulatory Visit: Payer: Self-pay

## 2021-03-06 ENCOUNTER — Emergency Department (HOSPITAL_COMMUNITY)
Admission: EM | Admit: 2021-03-06 | Discharge: 2021-03-06 | Disposition: A | Discharge status: Home / Self Care | Payer: BC Managed Care – PPO | Attending: Emergency Medicine | Admitting: Emergency Medicine

## 2021-03-06 DIAGNOSIS — R0789 Other chest pain: Secondary | ICD-10-CM | POA: Insufficient documentation

## 2021-03-06 DIAGNOSIS — Z7951 Long term (current) use of inhaled steroids: Secondary | ICD-10-CM | POA: Diagnosis not present

## 2021-03-06 DIAGNOSIS — J45909 Unspecified asthma, uncomplicated: Secondary | ICD-10-CM | POA: Insufficient documentation

## 2021-03-06 LAB — TROPONIN I (HIGH SENSITIVITY): Troponin I (High Sensitivity): <2 ng/L (ref <18)

## 2021-03-06 LAB — CBC
HCT: 36.1 % (ref 36.0–46.0)
Hemoglobin: 11.1 g/dL — ABNORMAL LOW (ref 12.0–15.0)
MCH: 28.1 pg (ref 26.0–34.0)
MCHC: 30.7 g/dL (ref 30.0–36.0)
MCV: 91.4 fL (ref 80.0–100.0)
Platelets: 281 10*3/uL (ref 150–400)
RBC: 3.95 MIL/uL (ref 3.87–5.11)
RDW: 13.2 % (ref 11.5–15.5)
WBC: 6.2 10*3/uL (ref 4.0–10.5)
nRBC: 0 % (ref 0.0–0.2)

## 2021-03-06 LAB — BASIC METABOLIC PANEL
Anion gap: 9 (ref 5–15)
BUN: 10 mg/dL (ref 6–20)
CO2: 23 mmol/L (ref 22–32)
Calcium: 9 mg/dL (ref 8.9–10.3)
Chloride: 104 mmol/L (ref 98–111)
Creatinine, Ser: 0.97 mg/dL (ref 0.44–1.00)
GFR, Estimated: >60 mL/min (ref >60)
Glucose, Bld: 88 mg/dL (ref 70–99)
Potassium: 3.8 mmol/L (ref 3.5–5.1)
Sodium: 136 mmol/L (ref 135–145)

## 2021-03-06 LAB — I-STAT BETA HCG BLOOD, ED (MC, WL, AP ONLY): I-stat hCG, quantitative: <5 m[IU]/mL (ref <5)

## 2021-03-06 MED ORDER — IBUPROFEN 200 MG PO TABS
600.0000 mg | ORAL_TABLET | Freq: Once | ORAL | Status: AC
  Administered 2021-03-06: 600 mg via ORAL
  Filled 2021-03-06: qty 1

## 2021-03-06 NOTE — ED Provider Triage Note (Signed)
Emergency Medicine Provider Triage Evaluation Note  Michele Jennings , a 26 y.o. female  was evaluated in triage.  Pt complains of left-sided chest pain onset today.  Her left-sided chest pain radiates into her left arm and left side of her back.  She has been seen multiple times for in the past.  She still has a burning sensation to the area.  She is currently wearing a Holter monitor that she has had on since yesterday.  She has associated shortness of breath, nausea.  Has not tried a medication for symptoms.  Denies fever, chills, nausea, vomiting.  Per patient chart review: She typically receives her care through [facility] in [place].  She had an evaluation with [facility] cardiology on 03/05/2021 due to her chest pain and palpitations left arm pain and rapid heart rate.  She was diagnosed with costochondritis and recommended to take ibuprofen 600 mg 3 times a day for the next 2 weeks.  She was also placed on a 2-week event monitor to rule out arrhythmias.  She is due to get a echo and a stress test to be rescheduled.  She is noted to be follow-up with them in 3 weeks.  Review of Systems  Positive: As per HPI above. Negative: Fever, chills  Physical Exam  BP 129/86 (BP Location: Right Arm)    Pulse 73    Temp (!) 97.5 F (36.4 C) (Oral)    Resp 18    LMP 02/10/2021 (Approximate) Comment: pt shielded   SpO2 99%  Gen:   Awake, no distress   Resp:  Normal effort  MSK:   Moves extremities without difficulty  Other:  Holter monitor in place.  No overlying skin changes.  No chest wall tenderness to palpation.  Medical Decision Making  Medically screening exam initiated at 9:11 PM.  Appropriate orders placed.  NINETTE WURZEL was informed that the remainder of the evaluation will be completed by another provider, this initial triage assessment does not replace that evaluation, and the importance of remaining in the ED until their evaluation is complete.    Kynnadi Dicenso A,  PA-C 03/06/21 2118

## 2021-03-06 NOTE — ED Provider Notes (Signed)
[facility] EMERGENCY DEPARTMENT Provider Note   CSN: [id number] Arrival date & time: 03/06/21  2052     History  Chief Complaint  Patient presents with   Chest Pain    Michele Jennings is a 26 y.o. female.  Pt is a 26 yo bf with a hx of asthma.  She has had several visits to the ED for cp.  She did see cardiology yesterday and was put on a monitor.  They put an event monitor on her yesterday.  She is getting set up with an echo and stress test.  They told her to take ibuprofen for the pain.  She has not done so.  She said pain started a week ago.  Nothing makes it better.  No f/c.  No n/v.      Home Medications Prior to Admission medications   Medication Sig Start Date End Date Taking? Authorizing Provider  albuterol (PROVENTIL) (2.5 MG/3ML) 0.083% nebulizer solution Take 3 mLs (2.5 mg total) by nebulization every 4 (four) hours as needed for wheezing or shortness of breath. 02/07/21   Molpus, John, MD  albuterol (VENTOLIN HFA) 108 (90 Base) MCG/ACT inhaler Inhale 1-2 puffs into the lungs every 6 (six) hours as needed for wheezing. 02/06/21   Terrilee Files, MD  ibuprofen (ADVIL) 200 MG tablet Take 200 mg by mouth every 6 (six) hours as needed for headache or moderate pain.    [provider]  Multiple Vitamin (MULTIVITAMIN) tablet Take 1 tablet by mouth daily.    [provider]  predniSONE (DELTASONE) 20 MG tablet Take 2 tablets (40 mg total) by mouth daily. 03/02/21   Prosperi, Christian H, PA-C      Allergies    Shellfish allergy, Other, and Tilactase    Review of Systems   Review of Systems  Cardiovascular:  Positive for chest pain.  All other systems reviewed and are negative.  Physical Exam Updated Vital Signs BP 129/86 (BP Location: Right Arm)    Pulse 73    Temp (!) 97.5 F (36.4 C) (Oral)    Resp 18    LMP 02/10/2021 (Approximate) Comment: pt shielded   SpO2 99%  Physical Exam Vitals and nursing note reviewed.   Constitutional:      Appearance: She is well-developed. She is obese.  HENT:     Head: Normocephalic and atraumatic.  Eyes:     Extraocular Movements: Extraocular movements intact.     Pupils: Pupils are equal, round, and reactive to light.  Cardiovascular:     Rate and Rhythm: Normal rate and regular rhythm.     Heart sounds: Normal heart sounds.  Pulmonary:     Effort: Pulmonary effort is normal.     Breath sounds: Normal breath sounds.  Abdominal:     General: Bowel sounds are normal.     Palpations: Abdomen is soft.  Musculoskeletal:        General: Normal range of motion.     Cervical back: Normal range of motion and neck supple.  Skin:    General: Skin is warm.     Capillary Refill: Capillary refill takes less than 2 seconds.  Neurological:     General: No focal deficit present.     Mental Status: She is alert and oriented to person, place, and time.  Psychiatric:        Mood and Affect: Mood normal.        Behavior: Behavior normal.    ED Results /  Procedures / Treatments   Labs (all labs ordered are listed, but only abnormal results are displayed) Labs Reviewed  CBC - Abnormal; Notable for the following components:      Result Value   Hemoglobin 11.1 (*)    All other components within normal limits  BASIC METABOLIC PANEL  I-STAT BETA HCG BLOOD, ED ([facility], [facility], [facility] ONLY)  TROPONIN I (HIGH SENSITIVITY)  TROPONIN I (HIGH SENSITIVITY)    EKG None  Radiology DG Chest 2 View  Result Date: 03/06/2021 CLINICAL DATA:  Chest pain. EXAM: CHEST - 2 VIEW COMPARISON:  Chest x-ray 03/02/2021 FINDINGS: The heart size and mediastinal contours are within normal limits. Both lungs are clear. The visualized skeletal structures are unremarkable. IMPRESSION: No active cardiopulmonary disease. Electronically Signed   By: Darliss Cheney M.D.   On: 03/06/2021 21:33    Procedures Procedures    Medications Ordered in ED Medications  ibuprofen (ADVIL) tablet 600 mg (has no  administration in time range)    ED Course/ Medical Decision Making/ A&P                           Medical Decision Making Amount and/or Complexity of Data Reviewed Labs: ordered. Radiology: ordered.  Labs and CXR and EKG reviewed.   Cardiac work up neg.  Heart score of 0.  Cardiology is working on etiology of her cp as an outpatient.  Pt is stable for d/c.  Return if worse.        Final Clinical Impression(s) / ED Diagnoses Final diagnoses:  Atypical chest pain    Rx / DC Orders ED Discharge Orders     None         Jacalyn Lefevre, MD 03/06/21 2258

## 2021-03-06 NOTE — ED Triage Notes (Signed)
Pt here for continued L side cp that radiates into L arm and into L side of back. Pt states she has been seen multiple times but still has this burning sensation. Pt has been wearing a heart monitor since yesterday. Pt endorses some shob due to pain and nausea.

## 2021-03-10 ENCOUNTER — Emergency Department (HOSPITAL_COMMUNITY)
Admission: EM | Admit: 2021-03-10 | Discharge: 2021-03-11 | Disposition: A | Discharge status: Home / Self Care | Payer: BC Managed Care – PPO | Attending: Emergency Medicine | Admitting: Emergency Medicine

## 2021-03-10 ENCOUNTER — Other Ambulatory Visit: Payer: Self-pay

## 2021-03-10 ENCOUNTER — Emergency Department (HOSPITAL_COMMUNITY): Payer: BC Managed Care – PPO

## 2021-03-10 ENCOUNTER — Encounter (HOSPITAL_COMMUNITY): Payer: Self-pay | Admitting: Emergency Medicine

## 2021-03-10 DIAGNOSIS — R0602 Shortness of breath: Secondary | ICD-10-CM | POA: Insufficient documentation

## 2021-03-10 DIAGNOSIS — R630 Anorexia: Secondary | ICD-10-CM | POA: Insufficient documentation

## 2021-03-10 DIAGNOSIS — J029 Acute pharyngitis, unspecified: Secondary | ICD-10-CM | POA: Insufficient documentation

## 2021-03-10 DIAGNOSIS — J45909 Unspecified asthma, uncomplicated: Secondary | ICD-10-CM | POA: Diagnosis not present

## 2021-03-10 DIAGNOSIS — Z20822 Contact with and (suspected) exposure to covid-19: Secondary | ICD-10-CM | POA: Diagnosis not present

## 2021-03-10 DIAGNOSIS — M79604 Pain in right leg: Secondary | ICD-10-CM | POA: Insufficient documentation

## 2021-03-10 DIAGNOSIS — K5900 Constipation, unspecified: Secondary | ICD-10-CM | POA: Diagnosis not present

## 2021-03-10 DIAGNOSIS — R0789 Other chest pain: Secondary | ICD-10-CM

## 2021-03-10 DIAGNOSIS — N9489 Other specified conditions associated with female genital organs and menstrual cycle: Secondary | ICD-10-CM | POA: Insufficient documentation

## 2021-03-10 DIAGNOSIS — R079 Chest pain, unspecified: Secondary | ICD-10-CM | POA: Diagnosis present

## 2021-03-10 LAB — CBC WITH DIFFERENTIAL/PLATELET
Abs Immature Granulocytes: 0.01 10*3/uL (ref 0.00–0.07)
Basophils Absolute: 0.1 10*3/uL (ref 0.0–0.1)
Basophils Relative: 1 %
Eosinophils Absolute: 0.4 10*3/uL (ref 0.0–0.5)
Eosinophils Relative: 5 %
HCT: 35.9 % — ABNORMAL LOW (ref 36.0–46.0)
Hemoglobin: 11.7 g/dL — ABNORMAL LOW (ref 12.0–15.0)
Immature Granulocytes: 0 %
Lymphocytes Relative: 57 %
Lymphs Abs: 3.9 10*3/uL (ref 0.7–4.0)
MCH: 29.1 pg (ref 26.0–34.0)
MCHC: 32.6 g/dL (ref 30.0–36.0)
MCV: 89.3 fL (ref 80.0–100.0)
Monocytes Absolute: 0.6 10*3/uL (ref 0.1–1.0)
Monocytes Relative: 8 %
Neutro Abs: 2 10*3/uL (ref 1.7–7.7)
Neutrophils Relative %: 29 %
Platelets: 314 10*3/uL (ref 150–400)
RBC: 4.02 MIL/uL (ref 3.87–5.11)
RDW: 13.2 % (ref 11.5–15.5)
WBC: 6.9 10*3/uL (ref 4.0–10.5)
nRBC: 0 % (ref 0.0–0.2)

## 2021-03-10 LAB — COMPREHENSIVE METABOLIC PANEL
ALT: 15 U/L (ref 0–44)
AST: 15 U/L (ref 15–41)
Albumin: 3.9 g/dL (ref 3.5–5.0)
Alkaline Phosphatase: 55 U/L (ref 38–126)
Anion gap: 10 (ref 5–15)
BUN: 15 mg/dL (ref 6–20)
CO2: 23 mmol/L (ref 22–32)
Calcium: 8.8 mg/dL — ABNORMAL LOW (ref 8.9–10.3)
Chloride: 101 mmol/L (ref 98–111)
Creatinine, Ser: 1.16 mg/dL — ABNORMAL HIGH (ref 0.44–1.00)
GFR, Estimated: >60 mL/min (ref >60)
Glucose, Bld: 97 mg/dL (ref 70–99)
Potassium: 3.7 mmol/L (ref 3.5–5.1)
Sodium: 134 mmol/L — ABNORMAL LOW (ref 135–145)
Total Bilirubin: 0.5 mg/dL (ref 0.3–1.2)
Total Protein: 7.4 g/dL (ref 6.5–8.1)

## 2021-03-10 LAB — I-STAT BETA HCG BLOOD, ED (MC, WL, AP ONLY): I-stat hCG, quantitative: <5 m[IU]/mL (ref <5)

## 2021-03-10 LAB — TROPONIN I (HIGH SENSITIVITY): Troponin I (High Sensitivity): <2 ng/L (ref <18)

## 2021-03-10 NOTE — ED Provider Triage Note (Signed)
Emergency Medicine Provider Triage Evaluation Note  Michele Jennings , a 26 y.o. female  was evaluated in triage.  Pt complains of chest pain.  Patient states that started 30 minutes prior to arrival.  Describes it as a central burning chest pain.  Reports associated shortness of breath, throat irritation, as well as a cough.  She states she was recently seen by cardiology 5 days ago and was placed on a cardiac monitor but lost the monitor recently and is not currently wearing it.  She was last seen in the emergency department yesterday and discharged in stable condition.  Physical Exam  BP 124/88    Pulse 77    Temp 97.7 F (36.5 C) (Oral)    Resp 16    Ht 5\' 1"  (1.549 m)    Wt 85.3 kg    LMP 02/10/2021 (Approximate) Comment: pt shielded   SpO2 100%    BMI 35.52 kg/m  Gen:   Awake, no distress   Resp:  Normal effort  MSK:   Moves extremities without difficulty  Other:    Medical Decision Making  Medically screening exam initiated at 10:49 PM.  Appropriate orders placed.  Michele Jennings was informed that the remainder of the evaluation will be completed by another provider, this initial triage assessment does not replace that evaluation, and the importance of remaining in the ED until their evaluation is complete.   Michele Schlatter, PA-C 03/10/21 2250

## 2021-03-10 NOTE — ED Triage Notes (Signed)
Pt was trying to go to sleep but reports some substernal chest "burning" that started about an hour ago.  Pt also reports "throat pains" she later indicated when she was breathing.  No signs of respiratory distress in triage.

## 2021-03-11 ENCOUNTER — Emergency Department (HOSPITAL_BASED_OUTPATIENT_CLINIC_OR_DEPARTMENT_OTHER)
Admit: 2021-03-11 | Discharge: 2021-03-11 | Disposition: A | Discharge status: Home / Self Care | Payer: BC Managed Care – PPO | Attending: Emergency Medicine | Admitting: Emergency Medicine

## 2021-03-11 DIAGNOSIS — M79604 Pain in right leg: Secondary | ICD-10-CM | POA: Diagnosis not present

## 2021-03-11 LAB — TROPONIN I (HIGH SENSITIVITY): Troponin I (High Sensitivity): 2 ng/L (ref <18)

## 2021-03-11 LAB — RESP PANEL BY RT-PCR (FLU A&B, COVID) ARPGX2
Influenza A by PCR: NEGATIVE
Influenza B by PCR: NEGATIVE
SARS Coronavirus 2 by RT PCR: NEGATIVE

## 2021-03-11 LAB — D-DIMER, QUANTITATIVE: D-Dimer, Quant: <0.27 ug/mL-FEU (ref 0.00–0.50)

## 2021-03-11 MED ORDER — SODIUM CHLORIDE 0.9 % IV BOLUS
1000.0000 mL | Freq: Once | INTRAVENOUS | Status: AC
  Administered 2021-03-11: 1000 mL via INTRAVENOUS

## 2021-03-11 MED ORDER — PANTOPRAZOLE SODIUM 40 MG PO TBEC
40.0000 mg | DELAYED_RELEASE_TABLET | ORAL | Status: AC
  Administered 2021-03-11: 40 mg via ORAL
  Filled 2021-03-11: qty 1

## 2021-03-11 NOTE — Discharge Instructions (Addendum)
Please return to the ED with any new or worsening symptoms Please follow-up with a primary care doctor in the next 5 to 7 days for ongoing evaluation and management. I have referred you to one.  Please refer to attached informational guidance on atypical chest pain

## 2021-03-11 NOTE — Progress Notes (Signed)
Right lower extremity venous duplex completed. Refer to "CV Proc" under chart review to view preliminary results.  03/11/2021 10:49 AM Eula Fried., MHA, RVT, RDCS, RDMS

## 2021-03-11 NOTE — ED Provider Notes (Addendum)
[facility] EMERGENCY DEPARTMENT Provider Note   CSN: [id number] Arrival date & time: 03/10/21  2228     History  Chief Complaint  Patient presents with   Chest Pain   Sore Throat    Michele Jennings is a 26 y.o. female with history of asthma.  Patient presents to the ED due to chest pain that radiates down her left arm and up into her jaw for the last 2 weeks.  Patient states that the pain comes and goes, worse at night, denies any worsening of pain with exertion.  Patient describes the pain as "sharp, burning, aching".  Patient states that she has attempted to alleviate pain utilizing ibuprofen without relief, denies any other aggravating or alleviating factors.  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  She also states that she makes a biweekly drive to [place] which is 2 hours away. Of note, patient has been seen for the same complaint on 02/28/21, 03/02/21, 03/06/21 all resulting in negative work-ups. Patient endorsing shortness of breath, chest pain, constipation x3 days, appetite change, right leg pain.  Patient denies fevers, nausea, vomiting, diarrhea, abdominal pain, leg swelling, coughing up blood.   Chest Pain Associated symptoms: shortness of breath   Associated symptoms: no abdominal pain, no fever, no nausea and no vomiting   Sore Throat Associated symptoms include chest pain and shortness of breath. Pertinent negatives include no abdominal pain.      Home Medications Prior to Admission medications   Medication Sig Start Date End Date Taking? Authorizing Provider  albuterol (PROVENTIL) (2.5 MG/3ML) 0.083% nebulizer solution Take 3 mLs (2.5 mg total) by nebulization every 4 (four) hours as needed for wheezing or shortness of breath. 02/07/21   Molpus, John, MD  albuterol (VENTOLIN HFA) 108 (90 Base) MCG/ACT inhaler Inhale 1-2 puffs into the lungs every 6 (six) hours as needed for wheezing.  02/06/21   Terrilee Files, MD  ibuprofen (ADVIL) 200 MG tablet Take 200 mg by mouth every 6 (six) hours as needed for headache or moderate pain.    [provider]  Multiple Vitamin (MULTIVITAMIN) tablet Take 1 tablet by mouth daily.    [provider]  predniSONE (DELTASONE) 20 MG tablet Take 2 tablets (40 mg total) by mouth daily. 03/02/21   Prosperi, Christian H, PA-C      Allergies    Shellfish allergy, Other, and Tilactase    Review of Systems   Review of Systems  Constitutional:  Positive for appetite change. Negative for fever.  Respiratory:  Positive for shortness of breath.   Cardiovascular:  Positive for chest pain. Negative for leg swelling.  Gastrointestinal:  Positive for constipation (x3 days). Negative for abdominal pain, diarrhea, nausea and vomiting.  All other systems reviewed and are negative.  Physical Exam Updated Vital Signs BP 97/74    Pulse 69    Temp 97.7 F (36.5 C) (Oral)    Resp 15    Ht 5\' 1"  (1.549 m)    Wt 85.3 kg    LMP 02/10/2021 (Approximate) Comment: pt shielded   SpO2 100%    BMI 35.52 kg/m  Physical Exam Vitals and nursing note reviewed.  Constitutional:      General: She is not in acute distress.    Appearance: She is not ill-appearing, toxic-appearing or diaphoretic.  HENT:     Head: Normocephalic and atraumatic.     Nose: Nose normal.  Mouth/Throat:     Mouth: Mucous membranes are moist.  Eyes:     Extraocular Movements: Extraocular movements intact.     Pupils: Pupils are equal, round, and reactive to light.  Neck:     Thyroid: No thyromegaly.     Vascular: No JVD.  Cardiovascular:     Rate and Rhythm: Normal rate and regular rhythm.  Pulmonary:     Effort: Pulmonary effort is normal.     Breath sounds: Normal breath sounds. No stridor. No wheezing, rhonchi or rales.  Abdominal:     General: Abdomen is flat. There is no distension.     Palpations: Abdomen is soft.     Tenderness: There is no abdominal  tenderness. There is no guarding.  Musculoskeletal:     Cervical back: Normal range of motion.     Right upper leg: Normal.     Left upper leg: Normal.     Right knee: Normal.     Left knee: Normal.     Right lower leg: Normal.     Left lower leg: Normal.  Lymphadenopathy:     Cervical: No cervical adenopathy.  Skin:    General: Skin is warm and dry.     Capillary Refill: Capillary refill takes less than 2 seconds.  Neurological:     Mental Status: She is alert.     GCS: GCS eye subscore is 4. GCS verbal subscore is 5. GCS motor subscore is 6.     Cranial Nerves: Cranial nerves 2-12 are intact. No cranial nerve deficit.     Sensory: Sensation is intact.     Motor: Motor function is intact. No weakness.     Coordination: Coordination is intact. Heel to [facility] Test normal.    ED Results / Procedures / Treatments   Labs (all labs ordered are listed, but only abnormal results are displayed) Labs Reviewed  COMPREHENSIVE METABOLIC PANEL - Abnormal; Notable for the following components:      Result Value   Sodium 134 (*)    Creatinine, Ser 1.16 (*)    Calcium 8.8 (*)    All other components within normal limits  CBC WITH DIFFERENTIAL/PLATELET - Abnormal; Notable for the following components:   Hemoglobin 11.7 (*)    HCT 35.9 (*)    All other components within normal limits  RESP PANEL BY RT-PCR (FLU A&B, COVID) ARPGX2  D-DIMER, QUANTITATIVE  I-STAT BETA HCG BLOOD, ED ([facility], [facility], [facility] ONLY)  TROPONIN I (HIGH SENSITIVITY)  TROPONIN I (HIGH SENSITIVITY)    EKG None  Radiology DG Chest 2 View  Result Date: 03/10/2021 CLINICAL DATA:  Chest pain. EXAM: CHEST - 2 VIEW COMPARISON:  Chest x-ray 03/09/2021. FINDINGS: Chest x-ray 03/09/2021. IMPRESSION: No active cardiopulmonary disease. Electronically Signed   By: Darliss Cheney M.D.   On: 03/10/2021 23:46   VAS [place] LOWER EXTREMITY VENOUS (DVT) (ONLY [facility] & [facility])  Result Date: 03/11/2021  Lower Venous DVT Study Patient Name:  Michele Jennings   Date of Exam:   03/11/2021 Medical Rec #: [id number]        Accession #:    [id number] Date of Birth: 1995-06-02       Patient Gender: F Patient Age:   25 years Exam Location:  [facility] Procedure:      VAS [place] LOWER EXTREMITY VENOUS (DVT) Referring Phys: Delice Bison --------------------------------------------------------------------------------  Indications: Right leg pain.  Comparison Study: No prior study Performing Technologist: Gertie Fey MHA, RDMS, RVT, RDCS  Examination Guidelines:  A complete evaluation includes B-mode imaging, spectral Doppler, color Doppler, and power Doppler as needed of all accessible portions of each vessel. Bilateral testing is considered an integral part of a complete examination. Limited examinations for reoccurring indications may be performed as noted. The reflux portion of the exam is performed with the patient in reverse Trendelenburg.  +---------+---------------+---------+-----------+----------+--------------+  RIGHT     Compressibility Phasicity Spontaneity Properties Thrombus Aging  +---------+---------------+---------+-----------+----------+--------------+  CFV       Full            Yes       Yes                                    +---------+---------------+---------+-----------+----------+--------------+  SFJ       Full                                                             +---------+---------------+---------+-----------+----------+--------------+  FV Prox   Full                                                             +---------+---------------+---------+-----------+----------+--------------+  FV Mid    Full                                                             +---------+---------------+---------+-----------+----------+--------------+  FV Distal Full                                                             +---------+---------------+---------+-----------+----------+--------------+  PFV       Full                                                              +---------+---------------+---------+-----------+----------+--------------+  POP       Full            Yes       Yes                                    +---------+---------------+---------+-----------+----------+--------------+  PTV       Full                                                             +---------+---------------+---------+-----------+----------+--------------+  PERO      Full                                                             +---------+---------------+---------+-----------+----------+--------------+   +----+---------------+---------+-----------+----------+--------------+  LEFT Compressibility Phasicity Spontaneity Properties Thrombus Aging  +----+---------------+---------+-----------+----------+--------------+  CFV  Full            Yes       Yes                                    +----+---------------+---------+-----------+----------+--------------+     Summary: RIGHT: - There is no evidence of deep vein thrombosis in the lower extremity.  - No cystic structure found in the popliteal fossa.  LEFT: - No evidence of common femoral vein obstruction.  *See table(s) above for measurements and observations.    Preliminary     Procedures Procedures    Medications Ordered in ED Medications  pantoprazole (PROTONIX) EC tablet 40 mg (40 mg Oral Given 03/11/21 0959)  sodium chloride 0.9 % bolus 1,000 mL (1,000 mLs Intravenous New Bag/Given 03/11/21 1002)    ED Course/ Medical Decision Making/ A&P                           Medical Decision Making Amount and/or Complexity of Data Reviewed Labs: ordered. ECG/medicine tests: ordered.  Risk Prescription drug management.   26 year old female presents to ED due to chest pain that radiates down left arm and into jaw, sore throat for the last 2 weeks.  Patient describes pain as burning.  She has been seen for this complaint and numerous number of times at this ED, each time given negative work-up.  Patient also  complaining of right leg pain that started 1 week ago.  On examination, patient is nonhypoxic, afebrile, nontachycardic, nontoxic in appearance, clear lung sounds bilaterally, no erythema or tenderness noted to the right lower extremity.  This patient we worked up utilizing the following studies personally interpreted and ordered by me: Respiratory panel, CBC, CMP, troponin, D-dimer, beta hCG, chest x-ray, ultrasound lower extremity  Treatments ordered by me while the patient has been in the ED include: DVT ultrasound study, Protonix 40 mg, 1 L IV fluid  CBC: Shows slightly decreased hemoglobin 11.7.  On examination of patient records, this is in line with her baseline. CMP: Shows elevated creatinine 1.16 most likely secondary to decreased oral intake.  I will treat this with 1 L of fluid. D-dimer: Negative Troponin: Results at 2. No elevation Beta-hCG: <5 Chest x-ray: Interpreted by me. Shows no atelectasis, cardiomegaly, pulmonary infiltrates or consolidations. Normal appearing chest xray DVT ultrasound: Shows no signs of DVT  At this time, cause of patient's chest pain unknown.  Her work-up here has been negative.  DVT ultrasound shows no signs of DVT, her D-dimer is negative.  At this time, I feel the patient is stable for discharge.  This patient needs to follow-up with a PCP for ongoing evaluation and management of this atypical chest pain, I referred her to one here today.  I have discussed the patient and her case with Dr. Dalene Seltzer who is in agreement with discharging the patient and having her follow-up as  an outpatient.  I have provided return precautions to the patient she voices understanding.  I have answered all the questions of the patient to her satisfaction.  The patient is stable at time of discharge.    Final Clinical Impression(s) / ED Diagnoses Final diagnoses:  Atypical chest pain    Rx / DC Orders ED Discharge Orders     None           Clent RidgesGroce, Amery Vandenbos  F, PA-C 03/11/21 1258    Alvira MondaySchlossman, Erin, MD 03/13/21 2207

## 2021-03-16 ENCOUNTER — Other Ambulatory Visit: Payer: Self-pay

## 2021-03-16 ENCOUNTER — Encounter (HOSPITAL_BASED_OUTPATIENT_CLINIC_OR_DEPARTMENT_OTHER): Payer: Self-pay | Admitting: Emergency Medicine

## 2021-03-16 ENCOUNTER — Emergency Department (HOSPITAL_BASED_OUTPATIENT_CLINIC_OR_DEPARTMENT_OTHER)
Admission: EM | Admit: 2021-03-16 | Discharge: 2021-03-16 | Disposition: A | Discharge status: Home / Self Care | Payer: BC Managed Care – PPO | Attending: Emergency Medicine | Admitting: Emergency Medicine

## 2021-03-16 DIAGNOSIS — J45909 Unspecified asthma, uncomplicated: Secondary | ICD-10-CM | POA: Diagnosis not present

## 2021-03-16 DIAGNOSIS — R079 Chest pain, unspecified: Secondary | ICD-10-CM | POA: Diagnosis present

## 2021-03-16 DIAGNOSIS — R051 Acute cough: Secondary | ICD-10-CM | POA: Insufficient documentation

## 2021-03-16 DIAGNOSIS — R0789 Other chest pain: Secondary | ICD-10-CM | POA: Insufficient documentation

## 2021-03-16 MED ORDER — HYDROCOD POLI-CHLORPHE POLI ER 10-8 MG/5ML PO SUER: 5.0000 mL | Freq: Two times a day (BID) | ORAL | 0 refills | Status: DC | PRN

## 2021-03-16 MED ORDER — HYDROCOD POLI-CHLORPHE POLI ER 10-8 MG/5ML PO SUER
5.0000 mL | Freq: Once | ORAL | Status: DC
  Filled 2021-03-16: qty 5

## 2021-03-16 MED ORDER — KETOROLAC TROMETHAMINE 15 MG/ML IJ SOLN
15.0000 mg | Freq: Once | INTRAMUSCULAR | Status: AC
Start: 2021-03-16 — End: 2021-03-16
  Administered 2021-03-16: 15 mg via INTRAVENOUS
  Filled 2021-03-16: qty 1

## 2021-03-16 NOTE — ED Triage Notes (Signed)
Pt in with L chest "heaviness" that she noticed when she woke up. Pain radiates to L arm. States hx of asthma, recently has noticed a dry cough. Denies any fevers, or nausea, is c/o mild sob.

## 2021-03-16 NOTE — ED Provider Notes (Signed)
[facility] Provider Note: Lowella Dell, MD, FACEP  CSN: [id number] MRN: [id number] ARRIVAL: 03/16/21 at 0407 ROOM: [facility]   CHIEF COMPLAINT  Chest Pain   HISTORY OF PRESENT ILLNESS  03/16/21 4:31 AM Michele Jennings is a 26 y.o. female with a history of asthma and multiple visits to the ED in the past month for atypical chest pain.  She has had several negative work-ups for cardiac etiology and negative D-dimers.  A CT scan of the chest was negative on 03/02/2021.  She is here with left upper chest pain that woke her up from sleep about 20 minutes prior to arrival.  She rates the pain as a 9 out of 10 and describes it as dull or tightness like.  She has also had a dry cough recently.  She has not had a fever..  It is worse with deep breathing or with palpation.  She is not short of breath with this.   Past Medical History:  Diagnosis Date   Asthma    Eczema    Seasonal allergies     Past Surgical History:  Procedure Laterality Date   lump removed from left leg.      Family History  Family history unknown: Yes    Social History   Tobacco Use   Smoking status: Never   Smokeless tobacco: Never  Vaping Use   Vaping Use: Never used  Substance Use Topics   Alcohol use: No   Drug use: No    Prior to Admission medications   Medication Sig Start Date End Date Taking? Authorizing Provider  chlorpheniramine-HYDROcodone (TUSSIONEX PENNKINETIC ER) 10-8 MG/5ML Take 5 mLs by mouth every 12 (twelve) hours as needed. 03/16/21  Yes Starsha Morning, MD  albuterol (PROVENTIL) (2.5 MG/3ML) 0.083% nebulizer solution Take 3 mLs (2.5 mg total) by nebulization every 4 (four) hours as needed for wheezing or shortness of breath. 02/07/21   Danie Hannig, MD  albuterol (VENTOLIN HFA) 108 (90 Base) MCG/ACT inhaler Inhale 1-2 puffs into the lungs every 6 (six) hours as needed for wheezing. 02/06/21   Terrilee Files, MD  Multiple Vitamin (MULTIVITAMIN) tablet Take 1 tablet by mouth  daily.    [provider]    Allergies Shellfish allergy, Other, and Tilactase   REVIEW OF SYSTEMS  Negative except as noted here or in the History of Present Illness.   PHYSICAL EXAMINATION  Initial Vital Signs Blood pressure 106/81, pulse 83, temperature 97.8 F (36.6 C), temperature source Oral, resp. rate 13, weight 86.2 kg, last menstrual period 03/08/2021, SpO2 99 %.  Examination General: Well-developed, well-nourished female in no acute distress; appearance consistent with age of record HENT: normocephalic; atraumatic Eyes: Normal appearance Neck: supple Heart: regular rate and rhythm; no murmur Lungs: clear to auscultation bilaterally Chest: Left upper chest wall tenderness Abdomen: soft; nondistended; nontender; bowel sounds present Extremities: No deformity; full range of motion; pulses normal Neurologic: Awake, alert and oriented; motor function intact in all extremities and symmetric; no facial droop Skin: Warm and dry Psychiatric: Normal mood and affect   RESULTS  Summary of this visit's results, reviewed and interpreted by myself:   EKG Interpretation  Date/Time:  Sunday March 16 2021 04:19:30 EST Ventricular Rate:  89 PR Interval:  148 QRS Duration: 93 QT Interval:  378 QTC Calculation: 460 R Axis:   75 Text Interpretation: Sinus rhythm Normal ECG No significant change was found Confirmed by Shawnette Augello ([id number]) on 03/16/2021 4:21:04 AM  Laboratory Studies: No results found for this or any previous visit (from the past 24 hour(s)). Imaging Studies: No results found.  ED COURSE and MDM  Nursing notes, initial and subsequent vitals signs, including pulse oximetry, reviewed and interpreted by myself.  Vitals:   03/16/21 0430 03/16/21 0445 03/16/21 0500 03/16/21 0515  BP: 105/78 110/79 110/79 111/80  Pulse: 74 72 65 74  Resp: (!) 9 12 15 13   Temp:      TempSrc:      SpO2: 98% 98% 100% 99%  Weight:       Medications   chlorpheniramine-HYDROcodone 10-8 MG/5ML suspension 5 mL (has no administration in time range)  ketorolac (TORADOL) 15 MG/ML injection 15 mg (15 mg Intravenous Given 03/16/21 0445)   5:28 AM Equivocal improvement with IV Toradol.  The presentation at this time is consistent with chest wall pain, possibly costochondritis.  Her chest is tender to palpation and with the movement of breathing.  She is not wheezing.  She is not short of breath.  She has no symptoms suggesting a viral illness apart from a cough.  She acknowledges she does not use her albuterol inhaler despite the cough.  She has had multiple work-ups for cardiac and thromboembolic etiology with reassuring outcomes.  I do not believe additional cardiology or PE testing is indicated at this time.   PROCEDURES  Procedures   ED DIAGNOSES     ICD-10-CM   1. Chest wall pain  R07.89     2. Acute cough  R05.1          Sumer Moorehouse, 05/14/21, MD 03/16/21 [phone]

## 2021-03-16 NOTE — ED Notes (Signed)
Patient verbalizes understanding of discharge instructions. Opportunity for questioning and answers were provided. Armband removed by staff, pt discharged from ED. Ambulated out to lobby  

## 2021-03-26 ENCOUNTER — Emergency Department (HOSPITAL_COMMUNITY): Payer: BC Managed Care – PPO

## 2021-03-26 ENCOUNTER — Encounter (HOSPITAL_COMMUNITY): Payer: Self-pay

## 2021-03-26 ENCOUNTER — Emergency Department (HOSPITAL_COMMUNITY)
Admission: EM | Admit: 2021-03-26 | Discharge: 2021-03-26 | Disposition: A | Discharge status: Home / Self Care | Payer: BC Managed Care – PPO | Attending: Emergency Medicine | Admitting: Emergency Medicine

## 2021-03-26 ENCOUNTER — Other Ambulatory Visit: Payer: Self-pay

## 2021-03-26 DIAGNOSIS — J45909 Unspecified asthma, uncomplicated: Secondary | ICD-10-CM | POA: Insufficient documentation

## 2021-03-26 DIAGNOSIS — R059 Cough, unspecified: Secondary | ICD-10-CM | POA: Insufficient documentation

## 2021-03-26 DIAGNOSIS — R002 Palpitations: Secondary | ICD-10-CM | POA: Diagnosis not present

## 2021-03-26 DIAGNOSIS — N9489 Other specified conditions associated with female genital organs and menstrual cycle: Secondary | ICD-10-CM | POA: Diagnosis not present

## 2021-03-26 DIAGNOSIS — R0602 Shortness of breath: Secondary | ICD-10-CM | POA: Insufficient documentation

## 2021-03-26 DIAGNOSIS — Z20822 Contact with and (suspected) exposure to covid-19: Secondary | ICD-10-CM | POA: Insufficient documentation

## 2021-03-26 DIAGNOSIS — R42 Dizziness and giddiness: Secondary | ICD-10-CM | POA: Diagnosis not present

## 2021-03-26 DIAGNOSIS — R072 Precordial pain: Secondary | ICD-10-CM

## 2021-03-26 LAB — CBC
HCT: 35.4 % — ABNORMAL LOW (ref 36.0–46.0)
Hemoglobin: 11.2 g/dL — ABNORMAL LOW (ref 12.0–15.0)
MCH: 28.6 pg (ref 26.0–34.0)
MCHC: 31.6 g/dL (ref 30.0–36.0)
MCV: 90.5 fL (ref 80.0–100.0)
Platelets: 233 10*3/uL (ref 150–400)
RBC: 3.91 MIL/uL (ref 3.87–5.11)
RDW: 13.7 % (ref 11.5–15.5)
WBC: 7.1 10*3/uL (ref 4.0–10.5)
nRBC: 0 % (ref 0.0–0.2)

## 2021-03-26 LAB — D-DIMER, QUANTITATIVE: D-Dimer, Quant: 0.45 ug/mL-FEU (ref 0.00–0.50)

## 2021-03-26 LAB — COMPREHENSIVE METABOLIC PANEL
ALT: 15 U/L (ref 0–44)
AST: 14 U/L — ABNORMAL LOW (ref 15–41)
Albumin: 3.8 g/dL (ref 3.5–5.0)
Alkaline Phosphatase: 51 U/L (ref 38–126)
Anion gap: 9 (ref 5–15)
BUN: 16 mg/dL (ref 6–20)
CO2: 20 mmol/L — ABNORMAL LOW (ref 22–32)
Calcium: 8.5 mg/dL — ABNORMAL LOW (ref 8.9–10.3)
Chloride: 106 mmol/L (ref 98–111)
Creatinine, Ser: 0.76 mg/dL (ref 0.44–1.00)
GFR, Estimated: >60 mL/min (ref >60)
Glucose, Bld: 82 mg/dL (ref 70–99)
Potassium: 3.5 mmol/L (ref 3.5–5.1)
Sodium: 135 mmol/L (ref 135–145)
Total Bilirubin: 0.3 mg/dL (ref 0.3–1.2)
Total Protein: 6.7 g/dL (ref 6.5–8.1)

## 2021-03-26 LAB — RESP PANEL BY RT-PCR (FLU A&B, COVID) ARPGX2
Influenza A by PCR: NEGATIVE
Influenza B by PCR: NEGATIVE
SARS Coronavirus 2 by RT PCR: NEGATIVE

## 2021-03-26 LAB — HCG, SERUM, QUALITATIVE: Preg, Serum: NEGATIVE

## 2021-03-26 LAB — TROPONIN I (HIGH SENSITIVITY): Troponin I (High Sensitivity): <2 ng/L (ref <18)

## 2021-03-26 MED ORDER — KETOROLAC TROMETHAMINE 15 MG/ML IJ SOLN
15.0000 mg | Freq: Once | INTRAMUSCULAR | Status: DC
  Filled 2021-03-26: qty 1

## 2021-03-26 NOTE — Discharge Instructions (Addendum)
You came to the emergency department today to be evaluated for your chest pain, shortness of breath, and cough.  Your physical exam and lab work were reassuring.  Please follow-up closely with your primary care provider for repeat evaluation of your chest pain.  Please take Ibuprofen (Advil, motrin) and Tylenol (acetaminophen) to relieve your pain.    You may take up to 600 MG (3 pills) of normal strength ibuprofen every 8 hours as needed.   You make take tylenol, up to 1,000 mg (two extra strength pills) every 8 hours as needed.   It is safe to take ibuprofen and tylenol at the same time as they work differently.   Do not take more than 3,000 mg tylenol in a 24 hour period (not more than one dose every 8 hours.  Please check all medication labels as many medications such as pain and cold medications may contain tylenol.  Do not drink alcohol while taking these medications.  Do not take other NSAID'S while taking ibuprofen (such as aleve or naproxen).  Please take ibuprofen with food to decrease stomach upset.  Get help right away if: Your chest pain gets worse. You have a cough that gets worse, or you cough up blood. You have severe pain in your abdomen. You faint. You have sudden, unexplained chest discomfort. You have sudden, unexplained discomfort in your arms, back, neck, or jaw. You have shortness of breath at any time. You suddenly start to sweat, or your skin gets clammy. You feel nausea or you vomit. You suddenly feel lightheaded or dizzy. You have severe weakness, or unexplained weakness or fatigue. Your heart begins to beat quickly, or it feels like it is skipping beats.

## 2021-03-26 NOTE — ED Provider Notes (Signed)
[facility] DEPT Provider Note   CSN: [id number] Arrival date & time: 03/26/21  0207     History  Chief Complaint  Patient presents with   Chest Pain   Shortness of Breath   Cough    Michele Jennings is a 26 y.o. female with past medical history of asthma, eczema, seasonal allergies.  Presents to the emergency department with a complaint of chest pain, shortness of breath, and cough.  Patient reports that she has had chest pain consistently over the last 2 months.  Pain is midsternal and radiates to her left jaw and left arm.  Patient describes pain as sharp and stabbing.  Pain is worse with deep inspiration and palpation.  Patient reports that she has tried ibuprofen in the past to help with her pain with minimal improvement.  Patient has not tried any modalities to alleviate her pain this morning.  States that pain started this morning after waking at 1 AM.  Pain has been constant since then.  Denies any associated nausea, vomiting, or diaphoresis.  Additionally patient reports feeling lightheaded and palpitations earlier this morning.  No lightheadedness or palpitations at present.  Patient is unsure how long the symptoms lasted for.  Patient also reports intermittent swelling to left ankle.  Patient reports that she has had a consistent cough over the last 2 months.  Patient reports cough is producing minimal amounts of clear mucus.  Patient denies any fever, chills, hemoptysis, syncope, history of DVT or PE, hormone use, recent surgery or trauma, unilateral leg swelling or tenderness.   Chest Pain Associated symptoms: cough, palpitations and shortness of breath   Associated symptoms: no abdominal pain, no back pain, no dizziness, no fever, no headache, no nausea and no vomiting   Shortness of Breath Associated symptoms: chest pain and cough   Associated symptoms: no abdominal pain, no fever, no headaches, no neck pain, no rash and no vomiting    Cough Associated symptoms: chest pain and shortness of breath   Associated symptoms: no chills, no fever, no headaches and no rash       Home Medications Prior to Admission medications   Medication Sig Start Date End Date Taking? Authorizing Provider  albuterol (PROVENTIL) (2.5 MG/3ML) 0.083% nebulizer solution Take 3 mLs (2.5 mg total) by nebulization every 4 (four) hours as needed for wheezing or shortness of breath. 02/07/21   Molpus, John, MD  albuterol (VENTOLIN HFA) 108 (90 Base) MCG/ACT inhaler Inhale 1-2 puffs into the lungs every 6 (six) hours as needed for wheezing. 02/06/21   Hayden Rasmussen, MD  chlorpheniramine-HYDROcodone [facility] PENNKINETIC ER) 10-8 MG/5ML Take 5 mLs by mouth every 12 (twelve) hours as needed. 03/16/21   Molpus, John, MD  Multiple Vitamin (MULTIVITAMIN) tablet Take 1 tablet by mouth daily.    [provider]      Allergies    Shellfish allergy, Other, and Tilactase    Review of Systems   Review of Systems  Constitutional:  Negative for chills and fever.  Eyes:  Negative for visual disturbance.  Respiratory:  Positive for cough and shortness of breath.   Cardiovascular:  Positive for chest pain, palpitations and leg swelling.  Gastrointestinal:  Negative for abdominal pain, nausea and vomiting.  Genitourinary:  Negative for difficulty urinating and dysuria.  Musculoskeletal:  Negative for back pain and neck pain.  Skin:  Negative for color change and rash.  Neurological:  Negative for dizziness, syncope, light-headedness and headaches.  Psychiatric/Behavioral:  Negative  for confusion.    Physical Exam Updated Vital Signs BP (!) 134/94 (BP Location: Right Arm)    Pulse 89    Temp 98.5 F (36.9 C) (Oral)    Resp 17    Ht 5\' 1"  (1.549 m)    Wt 87.6 kg    LMP 03/08/2021 (Approximate)    SpO2 99%    BMI 36.49 kg/m  Physical Exam Vitals and nursing note reviewed.  Constitutional:      General: She is not in acute distress.    Appearance:  She is not ill-appearing, toxic-appearing or diaphoretic.  HENT:     Head: Normocephalic.  Eyes:     General: No scleral icterus.       Right eye: No discharge.        Left eye: No discharge.  Cardiovascular:     Rate and Rhythm: Normal rate.     Pulses:          Radial pulses are 2+ on the right side and 2+ on the left side.     Heart sounds: Normal heart sounds. No murmur heard. Pulmonary:     Effort: Pulmonary effort is normal. No tachypnea, bradypnea or respiratory distress.     Breath sounds: Normal breath sounds. No stridor.  Chest:     Comments: Tenderness to sternum  Abdominal:     General: Abdomen is protuberant. There is no distension.     Palpations: Abdomen is soft. There is no mass or pulsatile mass.     Tenderness: There is no abdominal tenderness.  Musculoskeletal:     Right lower leg: No swelling or tenderness. No edema.     Left lower leg: No swelling or tenderness. No edema.  Skin:    General: Skin is warm and dry.  Neurological:     General: No focal deficit present.     Mental Status: She is alert.  Psychiatric:        Behavior: Behavior is cooperative.    ED Results / Procedures / Treatments   Labs (all labs ordered are listed, but only abnormal results are displayed) Labs Reviewed  CBC - Abnormal; Notable for the following components:      Result Value   Hemoglobin 11.2 (*)    HCT 35.4 (*)    All other components within normal limits  COMPREHENSIVE METABOLIC PANEL - Abnormal; Notable for the following components:   CO2 20 (*)    Calcium 8.5 (*)    AST 14 (*)    All other components within normal limits  RESP PANEL BY RT-PCR (FLU A&B, COVID) ARPGX2  HCG, SERUM, QUALITATIVE  D-DIMER, QUANTITATIVE  TROPONIN I (HIGH SENSITIVITY)    EKG None  Radiology DG Chest Portable 1 View  Result Date: 03/26/2021 CLINICAL DATA:  Shortness of breath, asthma EXAM: PORTABLE CHEST 1 VIEW COMPARISON:  03/10/2021 FINDINGS: Heart and mediastinal contours are  within normal limits. No focal opacities or effusions. No acute bony abnormality. IMPRESSION: No active disease. Electronically Signed   By: Rolm Baptise M.D.   On: 03/26/2021 02:40    Procedures Procedures    Medications Ordered in ED Medications  ketorolac (TORADOL) 15 MG/ML injection 15 mg (15 mg Intramuscular Patient Refused/Not Given 03/26/21 [id number])    ED Course/ Medical Decision Making/ A&P                           Medical Decision Making Amount and/or Complexity of Data Reviewed Labs:  ordered. Radiology: ordered.  Risk Prescription drug management.   Alert 26 year old female in no acute distress, nontoxic-appearing.  Presents to the emergency department the chief complaint of shortness of breath, chest pain, and cough.  Information was obtained from patient.  Past medical records were reviewed including previous provider notes, labs, and imaging.  patient has medical history of asthma which complicates her care.  Per chart review patient has been seen 4 times in the last 2 months for chest pain.  Patient received noncontrast chest CT on 1/22 which showed no acute abnormalities.  Has had negative ACS work-up as well as negative dimer in the past.  ACS work-up was initiated due to patient's reports of chest pain.  EKG was independently reviewed myself and shows sinus rhythm.  Chest x-ray was independent reviewed myself and shows no acute cardiopulmonary disease.  Lab work was independently reviewed myself pertinent findings include: -Troponin less than 2 -Respiratory panel negative for COVID-19 and influenza -hCG negative -CMP and CBC largely unremarkable -D-dimer within normal limits  Low suspicion for ACS at this time as patient has heart score of 1 with negative troponin and no signs of STEMI on EKG.  Due to patient reports of chest pain and shortness of breath will obtain D-dimer to evaluate for possible PE.  CTA to evaluate for PE was considered however D-dimer  within normal limits, low suspicion for PE at this time.  Suspect that patient's chest pain is musculoskeletal in nature due to her persistent coughing.  Patient was ordered Toradol for pain control.  Patient refused this medication when RN attempted to administer.  With persistent coughing suspect that patient's pain may be musculoskeletal in nature.  Patient was offered Tessalon to help reduce her cough.  Patient declines this medication.  Patient hemodynamically stable at this time.  With reassuring work-up.  We will have patient follow-up closely with her primary care provider.  Discussed results, findings, treatment and follow up. Patient advised of return precautions. Patient verbalized understanding and agreed with plan.           Final Clinical Impression(s) / ED Diagnoses Final diagnoses:  None    Rx / DC Orders ED Discharge Orders     None         Loni Beckwith, PA-C 03/26/21 0941    Valarie Merino, MD 03/26/21 1510

## 2021-03-26 NOTE — ED Notes (Signed)
Attempted to obtain labs, unable pt is difficult stick

## 2021-03-26 NOTE — ED Notes (Signed)
I provided reinforced discharge education based off of discharge instructions. Pt acknowledged and understood my education. Pt had no further questions/concerns for provider/myself.  °

## 2021-03-26 NOTE — ED Triage Notes (Signed)
Pt reports with SHOB, cough, and chest pain since 0200. Pt states that she has asthma and states that this is different.

## 2021-03-26 NOTE — ED Notes (Signed)
Attempted x2 Venipuncture in L Forearm and R AC. Unable to access.

## 2021-07-17 ENCOUNTER — Ambulatory Visit (INDEPENDENT_AMBULATORY_CARE_PROVIDER_SITE_OTHER): Payer: Medicaid Other

## 2021-07-17 ENCOUNTER — Ambulatory Visit
Admission: EM | Admit: 2021-07-17 | Discharge: 2021-07-17 | Disposition: A | Discharge status: Home / Self Care | Payer: Medicaid Other | Attending: Internal Medicine | Admitting: Internal Medicine

## 2021-07-17 DIAGNOSIS — M25561 Pain in right knee: Secondary | ICD-10-CM

## 2021-07-17 NOTE — ED Notes (Signed)
Knee brace applied to right knee.  

## 2021-07-17 NOTE — ED Provider Notes (Signed)
[facility]    CSN: [id number] Arrival date & time: 07/17/21  1708      History   Chief Complaint Chief Complaint  Patient presents with   leg numbness    HPI Michele Jennings is a 26 y.o. female.   Patient presents with right knee pain that radiates down the shin with associated mild numbness that started today.  Denies any apparent injury.  Denies any tingling.  Denies calf pain.  Denies history of chronic leg pain.  Patient has not taken any medications for pain.     Past Medical History:  Diagnosis Date   Asthma    Eczema    Seasonal allergies     There are no problems to display for this patient.   Past Surgical History:  Procedure Laterality Date   lump removed from left leg.      OB History   No obstetric history on file.      Home Medications    Prior to Admission medications   Medication Sig Start Date End Date Taking? Authorizing Provider  albuterol (PROVENTIL) (2.5 MG/3ML) 0.083% nebulizer solution Take 3 mLs (2.5 mg total) by nebulization every 4 (four) hours as needed for wheezing or shortness of breath. 02/07/21   Molpus, John, MD  albuterol (VENTOLIN HFA) 108 (90 Base) MCG/ACT inhaler Inhale 1-2 puffs into the lungs every 6 (six) hours as needed for wheezing. 02/06/21   Terrilee Files, MD  chlorpheniramine-HYDROcodone [facility] PENNKINETIC ER) 10-8 MG/5ML Take 5 mLs by mouth every 12 (twelve) hours as needed. 03/16/21   Molpus, John, MD  Multiple Vitamin (MULTIVITAMIN) tablet Take 1 tablet by mouth daily.    [provider]    Family History Family History  Family history unknown: Yes    Social History Social History   Tobacco Use   Smoking status: Never   Smokeless tobacco: Never  Vaping Use   Vaping Use: Never used  Substance Use Topics   Alcohol use: No   Drug use: No     Allergies   Shellfish allergy, Other, and Tilactase   Review of Systems Review of Systems Per HPI  Physical Exam Triage Vital  Signs ED Triage Vitals  Enc Vitals Group     BP 07/17/21 1724 113/77     Pulse Rate 07/17/21 1724 99     Resp 07/17/21 1724 18     Temp 07/17/21 1724 98.5 F (36.9 C)     Temp src --      SpO2 07/17/21 1724 99 %     Weight --      Height --      Head Circumference --      Peak Flow --      Pain Score 07/17/21 1726 5     Pain Loc --      Pain Edu? --      Excl. in GC? --    No data found.  Updated Vital Signs BP 113/77   Pulse 99   Temp 98.5 F (36.9 C)   Resp 18   SpO2 99%   Visual Acuity Right Eye Distance:   Left Eye Distance:   Bilateral Distance:    Right Eye Near:   Left Eye Near:    Bilateral Near:     Physical Exam Constitutional:      General: She is not in acute distress.    Appearance: Normal appearance. She is not toxic-appearing or diaphoretic.  HENT:     Head:  Normocephalic and atraumatic.  Eyes:     Extraocular Movements: Extraocular movements intact.     Conjunctiva/sclera: Conjunctivae normal.  Pulmonary:     Effort: Pulmonary effort is normal.  Musculoskeletal:     Comments: Tenderness to palpation generalized throughout anterior knee.  Patient has full range of motion.  No crepitus noted.  No tenderness to calf.  No obvious swelling, discoloration, warmth, lacerations, abrasions noted.  Pedal pulses and capillary refill normal.  Neurovascular intact.  Neurological:     General: No focal deficit present.     Mental Status: She is alert and oriented to person, place, and time. Mental status is at baseline.  Psychiatric:        Mood and Affect: Mood normal.        Behavior: Behavior normal.        Thought Content: Thought content normal.        Judgment: Judgment normal.      UC Treatments / Results  Labs (all labs ordered are listed, but only abnormal results are displayed) Labs Reviewed - No data to display  EKG   Radiology DG Knee Complete 4 Views Right  Result Date: 07/17/2021 CLINICAL DATA:  knee pain and numbness EXAM:  RIGHT KNEE - COMPLETE 4+ VIEW COMPARISON:  None Available. FINDINGS: No evidence of fracture, dislocation, or joint effusion. No evidence of arthropathy or other focal bone abnormality. Soft tissues are unremarkable. IMPRESSION: Negative. Electronically Signed   By: Marjo Bicker M.D.   On: 07/17/2021 17:49    Procedures Procedures (including critical care time)  Medications Ordered in UC Medications - No data to display  Initial Impression / Assessment and Plan / UC Course  I have reviewed the triage vital signs and the nursing notes.  Pertinent labs & imaging results that were available during my care of the patient were reviewed by me and considered in my medical decision making (see chart for details).     Right knee x-ray was negative for any acute abnormality.  No concern for DVT given physical exam and no recent surgery or long-term travel.  Unsure exact etiology of patient's pain but it does appear that she has inflammation given tenderness to palpation.  Discussed conservative treatment with over-the-counter pain relievers and ice application.  Knee brace applied in urgent care.  Patient to follow-up with provided contact information for orthopedist if pain persists or worsens.  Patient was given strict return and ER precautions.  Patient verbalized understanding and was agreeable with plan. Final Clinical Impressions(s) / UC Diagnoses   Final diagnoses:  Acute pain of right knee     Discharge Instructions      Your x-ray was normal.  Suspect that you may have inflammation of your knee.  Please use ice application.  A knee brace has been applied.  May take over-the-counter pain relievers as needed.  Follow-up with orthopedist.    ED Prescriptions   None    PDMP not reviewed this encounter.   Gustavus Bryant, [place] 07/17/21 [phone]

## 2021-07-17 NOTE — ED Triage Notes (Signed)
Patient presents to Urgent Care with complaints of right leg numbness since this morning at 11 am. Patient reports no otc medications or injuries that she knows about.

## 2021-07-17 NOTE — Discharge Instructions (Addendum)
Your x-ray was normal.  Suspect that you may have inflammation of your knee.  Please use ice application.  A knee brace has been applied.  May take over-the-counter pain relievers as needed.  Follow-up with orthopedist.

## 2021-09-23 ENCOUNTER — Encounter (HOSPITAL_BASED_OUTPATIENT_CLINIC_OR_DEPARTMENT_OTHER): Payer: Self-pay | Admitting: Urology

## 2021-09-23 ENCOUNTER — Emergency Department (HOSPITAL_BASED_OUTPATIENT_CLINIC_OR_DEPARTMENT_OTHER)
Admission: EM | Admit: 2021-09-23 | Discharge: 2021-09-23 | Disposition: A | Discharge status: Home / Self Care | Payer: BC Managed Care – PPO | Attending: Emergency Medicine | Admitting: Emergency Medicine

## 2021-09-23 DIAGNOSIS — R519 Headache, unspecified: Secondary | ICD-10-CM | POA: Insufficient documentation

## 2021-09-23 DIAGNOSIS — K0889 Other specified disorders of teeth and supporting structures: Secondary | ICD-10-CM | POA: Insufficient documentation

## 2021-09-23 MED ORDER — PENICILLIN V POTASSIUM 500 MG PO TABS: 500.0000 mg | ORAL_TABLET | Freq: Three times a day (TID) | ORAL | 0 refills | Status: DC

## 2021-09-23 MED ORDER — NAPROXEN 500 MG PO TABS: 500.0000 mg | ORAL_TABLET | Freq: Two times a day (BID) | ORAL | 0 refills | Status: DC

## 2021-09-23 NOTE — ED Provider Notes (Signed)
[facility] EMERGENCY DEPARTMENT Provider Note   CSN: [id number] Arrival date & time: 09/23/21  1232     History  Chief Complaint  Patient presents with   Dental Pain    Michele Jennings is a 26 y.o. female.  Patient presents to the emergency department for evaluation of several days of left cheek pain.  She is concerned about a tooth ache.  No fevers, ear pain, difficulty breathing or swallowing.  Symptoms started 3 days ago.  She thinks that her face looks a little bit swollen on that side when she looks at herself on the phone.       Home Medications Prior to Admission medications   Medication Sig Start Date End Date Taking? Authorizing Provider  naproxen (NAPROSYN) 500 MG tablet Take 1 tablet (500 mg total) by mouth 2 (two) times daily. 09/23/21  Yes Renne Crigler, PA-C  penicillin v potassium (VEETID) 500 MG tablet Take 1 tablet (500 mg total) by mouth 3 (three) times daily. 09/23/21  Yes Renne Crigler, PA-C  albuterol (PROVENTIL) (2.5 MG/3ML) 0.083% nebulizer solution Take 3 mLs (2.5 mg total) by nebulization every 4 (four) hours as needed for wheezing or shortness of breath. 02/07/21   Molpus, John, MD  albuterol (VENTOLIN HFA) 108 (90 Base) MCG/ACT inhaler Inhale 1-2 puffs into the lungs every 6 (six) hours as needed for wheezing. 02/06/21   Terrilee Files, MD  chlorpheniramine-HYDROcodone [facility] PENNKINETIC ER) 10-8 MG/5ML Take 5 mLs by mouth every 12 (twelve) hours as needed. 03/16/21   Molpus, John, MD  Multiple Vitamin (MULTIVITAMIN) tablet Take 1 tablet by mouth daily.    [provider]      Allergies    Shellfish allergy, Other, and Tilactase    Review of Systems   Review of Systems  Physical Exam Updated Vital Signs BP 121/72 (BP Location: Left Arm)   Pulse 68   Temp 98.6 F (37 C) (Oral)   Resp 18   Ht 5\' 1"  (1.549 m)   Wt 81.6 kg   LMP 09/15/2021 (Approximate)   SpO2 99%   BMI 34.01 kg/m   Physical Exam Vitals and nursing  note reviewed.  Constitutional:      Appearance: She is well-developed.  HENT:     Head: Normocephalic and atraumatic.     Jaw: No trismus.     Right Ear: Tympanic membrane, ear canal and external ear normal.     Left Ear: Tympanic membrane, ear canal and external ear normal.     Nose: Nose normal.     Mouth/Throat:     Dentition: Abnormal dentition. Dental caries present. No dental abscesses.     Pharynx: Uvula midline. No uvula swelling.     Tonsils: No tonsillar abscesses.     Comments: Good dentition.  No obvious cavities.  No gross gum swelling or abscess.  Trace left cheek swelling. Eyes:     Conjunctiva/sclera: Conjunctivae normal.  Neck:     Comments: No neck swelling or Ludwig's angina Musculoskeletal:     Cervical back: Normal range of motion and neck supple.  Lymphadenopathy:     Cervical: No cervical adenopathy.  Skin:    General: Skin is warm and dry.  Neurological:     Mental Status: She is alert.     ED Results / Procedures / Treatments   Labs (all labs ordered are listed, but only abnormal results are displayed) Labs Reviewed - No data to display  EKG None  Radiology No results  found.  Procedures Procedures    Medications Ordered in ED Medications - No data to display  ED Course/ Medical Decision Making/ A&P    Patient seen and examined. History obtained directly from patient.   Labs/EKG: None ordered.  Imaging: None ordered.  Medications/Fluids: Ordered: None ordered  Most recent vital signs reviewed and are as follows: BP 121/72 (BP Location: Left Arm)   Pulse 68   Temp 98.6 F (37 C) (Oral)   Resp 18   Ht 5\' 1"  (1.549 m)   Wt 81.6 kg   LMP 09/15/2021 (Approximate)   SpO2 99%   BMI 34.01 kg/m   Initial impression: dental pain/dental infection  Plan: Discharge to home.   Prescriptions written for: Penicillin, naproxen  Other home care instructions discussed: Avoidance of chewing or other activities that makes the symptoms  worse. Eat soft foods if needed and maintain good hydration.   ED return instructions discussed: Encouraged patient to return with worsening facial or neck swelling, difficulty breathing or swallowing, fever.   Follow-up instructions discussed: Patient encouraged to follow-up with provided dental referral, resources -- or their own dentist if able.                           Medical Decision Making Risk Prescription drug management.   Patient presents for dental pain. They do not have a fever and do not appear septic. Exam unconcerning for Ludwig's angina or other deep tissue infection in neck and I do not feel that advanced imaging is indicated at this time. Low suspicion for PTA, RPA, epiglottis based on exam.   Patient will be treated for dental infection with antibiotic. Encouraged tylenol/NSAIDs as prescribed or as directed on the packaging for pain. Encouraged follow-up with a dentist for definitive and long-term management.          Final Clinical Impression(s) / ED Diagnoses Final diagnoses:  Facial pain    Rx / DC Orders ED Discharge Orders          Ordered    penicillin v potassium (VEETID) 500 MG tablet  3 times daily        09/23/21 1414    naproxen (NAPROSYN) 500 MG tablet  2 times daily        09/23/21 1414              09/25/21, PA-C 09/23/21 1504    09/25/21, MD 09/28/21 2011

## 2021-09-23 NOTE — Discharge Instructions (Signed)
Please read and follow all provided instructions.  Your diagnoses today include:  1. Facial pain    The exam and treatment you received today has been provided on an emergency basis only. This is not a substitute for complete medical or dental care.  Tests performed today include: Vital signs. See below for your results today.   Medications prescribed:  Penicillin - antibiotic  You have been prescribed an antibiotic medicine: take the entire course of medicine even if you are feeling better. Stopping early can cause the antibiotic not to work.  Naproxen - anti-inflammatory pain medication Do not exceed 500mg  naproxen every 12 hours, take with food  You have been prescribed an anti-inflammatory medication or NSAID. Take with food. Take smallest effective dose for the shortest duration needed for your pain. Stop taking if you experience stomach pain or vomiting.   Take any prescribed medications only as directed.  Home care instructions:  Follow any educational materials contained in this packet.  Follow-up instructions: Please follow-up with your dentist for further evaluation of your symptoms.   Return instructions:  Please return to the Emergency Department if you experience worsening symptoms. Please return if you develop a fever, you develop more swelling in your face or neck, you have trouble breathing or swallowing food. Please return if you have any other emergent concerns.  Additional Information:  Your vital signs today were: BP 121/72 (BP Location: Left Arm)   Pulse 68   Temp 98.6 F (37 C) (Oral)   Resp 18   Ht 5\' 1"  (1.549 m)   Wt 81.6 kg   LMP 09/15/2021 (Approximate)   SpO2 99%   BMI 34.01 kg/m  If your blood pressure (BP) was elevated above 135/85 this visit, please have this repeated by your doctor within one month. --------------

## 2021-09-23 NOTE — ED Triage Notes (Signed)
Left upper dental pain x 3 days No facial swelling, no fever  Still has wisdom teeth

## 2021-09-24 DIAGNOSIS — J4520 Mild intermittent asthma, uncomplicated: Secondary | ICD-10-CM | POA: Insufficient documentation

## 2021-09-24 DIAGNOSIS — G4733 Obstructive sleep apnea (adult) (pediatric): Secondary | ICD-10-CM | POA: Insufficient documentation

## 2021-09-24 DIAGNOSIS — J45909 Unspecified asthma, uncomplicated: Secondary | ICD-10-CM | POA: Insufficient documentation

## 2021-09-24 DIAGNOSIS — K219 Gastro-esophageal reflux disease without esophagitis: Secondary | ICD-10-CM | POA: Insufficient documentation

## 2021-09-24 HISTORY — DX: Mild intermittent asthma, uncomplicated: J45.20

## 2021-09-24 HISTORY — DX: Gastro-esophageal reflux disease without esophagitis: K21.9

## 2021-09-24 HISTORY — DX: Obstructive sleep apnea (adult) (pediatric): G47.33

## 2021-09-26 DIAGNOSIS — F419 Anxiety disorder, unspecified: Secondary | ICD-10-CM | POA: Insufficient documentation

## 2021-09-26 DIAGNOSIS — Z5941 Food insecurity: Secondary | ICD-10-CM | POA: Insufficient documentation

## 2021-09-26 HISTORY — DX: Anxiety disorder, unspecified: F41.9

## 2021-09-26 HISTORY — DX: Food insecurity: Z59.41

## 2021-10-02 ENCOUNTER — Encounter (HOSPITAL_BASED_OUTPATIENT_CLINIC_OR_DEPARTMENT_OTHER): Payer: Self-pay | Admitting: Emergency Medicine

## 2021-10-02 ENCOUNTER — Emergency Department (HOSPITAL_BASED_OUTPATIENT_CLINIC_OR_DEPARTMENT_OTHER)
Admission: EM | Admit: 2021-10-02 | Discharge: 2021-10-02 | Disposition: A | Discharge status: Home / Self Care | Payer: BC Managed Care – PPO | Attending: Emergency Medicine | Admitting: Emergency Medicine

## 2021-10-02 ENCOUNTER — Other Ambulatory Visit: Payer: Self-pay

## 2021-10-02 ENCOUNTER — Emergency Department (HOSPITAL_BASED_OUTPATIENT_CLINIC_OR_DEPARTMENT_OTHER): Payer: BC Managed Care – PPO

## 2021-10-02 DIAGNOSIS — R112 Nausea with vomiting, unspecified: Secondary | ICD-10-CM | POA: Diagnosis not present

## 2021-10-02 DIAGNOSIS — Z7951 Long term (current) use of inhaled steroids: Secondary | ICD-10-CM | POA: Insufficient documentation

## 2021-10-02 DIAGNOSIS — E860 Dehydration: Secondary | ICD-10-CM | POA: Insufficient documentation

## 2021-10-02 DIAGNOSIS — R079 Chest pain, unspecified: Secondary | ICD-10-CM | POA: Diagnosis present

## 2021-10-02 LAB — URINALYSIS, ROUTINE W REFLEX MICROSCOPIC
Bilirubin Urine: NEGATIVE
Glucose, UA: NEGATIVE mg/dL
Hgb urine dipstick: NEGATIVE
Ketones, ur: NEGATIVE mg/dL
Leukocytes,Ua: NEGATIVE
Nitrite: NEGATIVE
Protein, ur: NEGATIVE mg/dL
Specific Gravity, Urine: 1.025 (ref 1.005–1.030)
pH: 7 (ref 5.0–8.0)

## 2021-10-02 LAB — CBC
HCT: 32.6 % — ABNORMAL LOW (ref 36.0–46.0)
Hemoglobin: 10.5 g/dL — ABNORMAL LOW (ref 12.0–15.0)
MCH: 27.9 pg (ref 26.0–34.0)
MCHC: 32.2 g/dL (ref 30.0–36.0)
MCV: 86.5 fL (ref 80.0–100.0)
Platelets: 243 K/uL (ref 150–400)
RBC: 3.77 MIL/uL — ABNORMAL LOW (ref 3.87–5.11)
RDW: 13.7 % (ref 11.5–15.5)
WBC: 6.9 K/uL (ref 4.0–10.5)
nRBC: 0 % (ref 0.0–0.2)

## 2021-10-02 LAB — COMPREHENSIVE METABOLIC PANEL
ALT: 16 U/L (ref 0–44)
AST: 18 U/L (ref 15–41)
Albumin: 3.8 g/dL (ref 3.5–5.0)
Alkaline Phosphatase: 64 U/L (ref 38–126)
Anion gap: 8 (ref 5–15)
BUN: 18 mg/dL (ref 6–20)
CO2: 23 mmol/L (ref 22–32)
Calcium: 8.6 mg/dL — ABNORMAL LOW (ref 8.9–10.3)
Chloride: 104 mmol/L (ref 98–111)
Creatinine, Ser: 1.06 mg/dL — ABNORMAL HIGH (ref 0.44–1.00)
GFR, Estimated: >60 mL/min (ref >60)
Glucose, Bld: 98 mg/dL (ref 70–99)
Potassium: 3.7 mmol/L (ref 3.5–5.1)
Sodium: 135 mmol/L (ref 135–145)
Total Bilirubin: 0.5 mg/dL (ref 0.3–1.2)
Total Protein: 7.9 g/dL (ref 6.5–8.1)

## 2021-10-02 LAB — PREGNANCY, URINE: Preg Test, Ur: NEGATIVE

## 2021-10-02 LAB — LIPASE, BLOOD: Lipase: 26 U/L (ref 11–51)

## 2021-10-02 MED ORDER — SODIUM CHLORIDE 0.9 % IV BOLUS
1000.0000 mL | Freq: Once | INTRAVENOUS | Status: AC
  Administered 2021-10-02: 1000 mL via INTRAVENOUS

## 2021-10-02 MED ORDER — FAMOTIDINE IN NACL 20-0.9 MG/50ML-% IV SOLN
20.0000 mg | Freq: Once | INTRAVENOUS | Status: AC
Start: 2021-10-02 — End: 2021-10-02
  Administered 2021-10-02: 20 mg via INTRAVENOUS
  Filled 2021-10-02: qty 50

## 2021-10-02 MED ORDER — ONDANSETRON 4 MG PO TBDP: ORAL_TABLET | ORAL | 0 refills | Status: DC

## 2021-10-02 MED ORDER — ONDANSETRON HCL 4 MG/2ML IJ SOLN
4.0000 mg | Freq: Once | INTRAMUSCULAR | Status: AC
  Administered 2021-10-02: 4 mg via INTRAVENOUS
  Filled 2021-10-02: qty 2

## 2021-10-02 NOTE — Discharge Instructions (Addendum)
Take zofran for nausea   Stay hydrated   Your labs are normal today   See your doctor   Return to ER if you have worse vomiting, abdominal pain

## 2021-10-02 NOTE — ED Triage Notes (Signed)
Nausea and vomiting x 2 days. Pt denies diarrhea. Chest pain started today.

## 2021-10-02 NOTE — ED Provider Notes (Signed)
[facility] EMERGENCY DEPARTMENT Provider Note   CSN: [id number] Arrival date & time: 10/02/21  2000     History  Chief Complaint  Patient presents with   Emesis    Michele Jennings is a 26 y.o. female here with vomiting and chest pain. Patient states that she started having nausea vomiting the last 2 days.  She states that she has some burning sensation in her chest today.  She denies any fevers.  Denies any sick contacts.  The history is provided by the patient.       Home Medications Prior to Admission medications   Medication Sig Start Date End Date Taking? Authorizing Provider  albuterol (PROVENTIL) (2.5 MG/3ML) 0.083% nebulizer solution Take 3 mLs (2.5 mg total) by nebulization every 4 (four) hours as needed for wheezing or shortness of breath. 02/07/21   Molpus, John, MD  albuterol (VENTOLIN HFA) 108 (90 Base) MCG/ACT inhaler Inhale 1-2 puffs into the lungs every 6 (six) hours as needed for wheezing. 02/06/21   Terrilee Files, MD  chlorpheniramine-HYDROcodone [facility] PENNKINETIC ER) 10-8 MG/5ML Take 5 mLs by mouth every 12 (twelve) hours as needed. 03/16/21   Molpus, John, MD  Multiple Vitamin (MULTIVITAMIN) tablet Take 1 tablet by mouth daily.    [provider]  naproxen (NAPROSYN) 500 MG tablet Take 1 tablet (500 mg total) by mouth 2 (two) times daily. 09/23/21   Renne Crigler, PA-C  penicillin v potassium (VEETID) 500 MG tablet Take 1 tablet (500 mg total) by mouth 3 (three) times daily. 09/23/21   Renne Crigler, PA-C      Allergies    Shellfish allergy, Other, and Tilactase    Review of Systems   Review of Systems  Gastrointestinal:  Positive for vomiting.  All other systems reviewed and are negative.   Physical Exam Updated Vital Signs BP 128/84 (BP Location: Left Arm)   Pulse 72   Temp 98.4 F (36.9 C) (Oral)   Resp 18   Ht 5\' 1"  (1.549 m)   Wt 81.2 kg   LMP 09/15/2021   SpO2 99%   BMI 33.82 kg/m  Physical Exam Vitals and  nursing note reviewed.  Constitutional:      Comments: Slightly dehydrated   HENT:     Head: Normocephalic.     Nose: Nose normal.     Mouth/Throat:     Mouth: Mucous membranes are dry.  Eyes:     Extraocular Movements: Extraocular movements intact.     Pupils: Pupils are equal, round, and reactive to light.  Cardiovascular:     Rate and Rhythm: Normal rate and regular rhythm.     Pulses: Normal pulses.     Heart sounds: Normal heart sounds.  Pulmonary:     Effort: Pulmonary effort is normal.     Breath sounds: Normal breath sounds.  Abdominal:     General: Abdomen is flat.     Palpations: Abdomen is soft.     Comments: Nontender   Musculoskeletal:        General: Normal range of motion.     Cervical back: Normal range of motion and neck supple.  Skin:    General: Skin is warm.     Capillary Refill: Capillary refill takes less than 2 seconds.  Neurological:     General: No focal deficit present.     Mental Status: She is oriented to person, place, and time.  Psychiatric:        Mood and Affect: Mood normal.  Behavior: Behavior normal.     ED Results / Procedures / Treatments   Labs (all labs ordered are listed, but only abnormal results are displayed) Labs Reviewed  COMPREHENSIVE METABOLIC PANEL - Abnormal; Notable for the following components:      Result Value   Creatinine, Ser 1.06 (*)    Calcium 8.6 (*)    All other components within normal limits  CBC - Abnormal; Notable for the following components:   RBC 3.77 (*)    Hemoglobin 10.5 (*)    HCT 32.6 (*)    All other components within normal limits  LIPASE, BLOOD  URINALYSIS, ROUTINE W REFLEX MICROSCOPIC  PREGNANCY, URINE    EKG EKG Interpretation  Date/Time:  Thursday October 02 2021 20:27:11 EDT Ventricular Rate:  73 PR Interval:  140 QRS Duration: 89 QT Interval:  382 QTC Calculation: 421 R Axis:   75 Text Interpretation: Sinus rhythm Borderline Q waves in inferior leads No significant  change since last tracing Confirmed by Richardean Canal [phone]) on 10/02/2021 9:42:08 PM  Radiology No results found.  Procedures Procedures    Medications Ordered in ED Medications  sodium chloride 0.9 % bolus 1,000 mL (has no administration in time range)  ondansetron (ZOFRAN) injection 4 mg (has no administration in time range)  famotidine (PEPCID) IVPB 20 mg premix (has no administration in time range)    ED Course/ Medical Decision Making/ A&P                           Medical Decision Making Michele Jennings is a 27 y.o. female here with nausea and vomiting. Likely viral gastroenteritis. Will get labs and give antiemetics and hydrate and reassess   11:22 PM I reviewed labs and CXR and they were unremarkable. Given IVF and zofran and felt better. Likely viral gastroenteritis. Stable for discharge    Problems Addressed: Nausea and vomiting, unspecified vomiting type: acute illness or injury  Amount and/or Complexity of Data Reviewed Labs: ordered. Decision-making details documented in ED Course. Radiology: ordered and independent interpretation performed. Decision-making details documented in ED Course.  Risk Prescription drug management.    Final Clinical Impression(s) / ED Diagnoses Final diagnoses:  None    Rx / DC Orders ED Discharge Orders     None         Charlynne Pander, MD 10/02/21 [phone]

## 2021-10-17 ENCOUNTER — Other Ambulatory Visit: Payer: Self-pay

## 2021-10-17 ENCOUNTER — Encounter (HOSPITAL_BASED_OUTPATIENT_CLINIC_OR_DEPARTMENT_OTHER): Payer: Self-pay

## 2021-10-17 ENCOUNTER — Emergency Department (HOSPITAL_BASED_OUTPATIENT_CLINIC_OR_DEPARTMENT_OTHER): Payer: BC Managed Care – PPO

## 2021-10-17 ENCOUNTER — Emergency Department (HOSPITAL_BASED_OUTPATIENT_CLINIC_OR_DEPARTMENT_OTHER)
Admission: EM | Admit: 2021-10-17 | Discharge: 2021-10-17 | Disposition: A | Discharge status: Home / Self Care | Payer: BC Managed Care – PPO | Attending: Emergency Medicine | Admitting: Emergency Medicine

## 2021-10-17 DIAGNOSIS — M549 Dorsalgia, unspecified: Secondary | ICD-10-CM | POA: Insufficient documentation

## 2021-10-17 DIAGNOSIS — K219 Gastro-esophageal reflux disease without esophagitis: Secondary | ICD-10-CM | POA: Insufficient documentation

## 2021-10-17 DIAGNOSIS — R1013 Epigastric pain: Secondary | ICD-10-CM | POA: Diagnosis present

## 2021-10-17 MED ORDER — HALOPERIDOL LACTATE 5 MG/ML IJ SOLN
2.0000 mg | Freq: Once | INTRAMUSCULAR | Status: DC
  Filled 2021-10-17: qty 1

## 2021-10-17 MED ORDER — ALUM & MAG HYDROXIDE-SIMETH 200-200-20 MG/5ML PO SUSP
30.0000 mL | Freq: Once | ORAL | Status: AC
  Administered 2021-10-17: 30 mL via ORAL
  Filled 2021-10-17: qty 30

## 2021-10-17 MED ORDER — NAPROXEN 500 MG PO TABS: 500.0000 mg | ORAL_TABLET | Freq: Two times a day (BID) | ORAL | 0 refills | Status: DC

## 2021-10-17 MED ORDER — KETOROLAC TROMETHAMINE 60 MG/2ML IM SOLN
30.0000 mg | Freq: Once | INTRAMUSCULAR | Status: AC
  Administered 2021-10-17: 30 mg via INTRAMUSCULAR
  Filled 2021-10-17: qty 2

## 2021-10-17 MED ORDER — SUCRALFATE 1 GM/10ML PO SUSP: 1.0000 g | Freq: Three times a day (TID) | ORAL | 0 refills | Status: DC

## 2021-10-17 NOTE — ED Triage Notes (Signed)
Pt woke up with epigastric pain 10/10 and heart racing and "felt like I was going to pass out and nauseated".

## 2021-10-17 NOTE — ED Provider Notes (Signed)
[facility] EMERGENCY DEPARTMENT Provider Note   CSN: [id number] Arrival date & time: 10/17/21  0351     History  Chief Complaint  Patient presents with   Abdominal Pain    Michele Jennings is a 26 y.o. female.  The history is provided by the patient.  Abdominal Pain Pain location:  Epigastric Pain radiates to:  Does not radiate Pain severity:  Moderate Onset quality:  Sudden Duration:  1 hour Timing:  Constant Chronicity:  Recurrent Context: eating   Context: not trauma   Relieved by:  Nothing Worsened by:  Nothing Ineffective treatments:  None tried Associated symptoms: no chest pain, no chills, no constipation, no diarrhea, no fever, no shortness of breath and no vomiting   Risk factors: no NSAID use and not pregnant   Patient with GERD ate [place] food and went to bed and awoke with epigastric pain felt her heart was racing and had nausea.  Nausea went away.  She has also had back pain for several days. No trauma.  Has missed her prilosec.       Home Medications Prior to Admission medications   Medication Sig Start Date End Date Taking? Authorizing Provider  albuterol (PROVENTIL) (2.5 MG/3ML) 0.083% nebulizer solution Take 3 mLs (2.5 mg total) by nebulization every 4 (four) hours as needed for wheezing or shortness of breath. 02/07/21   Molpus, John, MD  albuterol (VENTOLIN HFA) 108 (90 Base) MCG/ACT inhaler Inhale 1-2 puffs into the lungs every 6 (six) hours as needed for wheezing. 02/06/21   Terrilee Files, MD  chlorpheniramine-HYDROcodone [facility] PENNKINETIC ER) 10-8 MG/5ML Take 5 mLs by mouth every 12 (twelve) hours as needed. 03/16/21   Molpus, John, MD  Multiple Vitamin (MULTIVITAMIN) tablet Take 1 tablet by mouth daily.    [provider]  naproxen (NAPROSYN) 500 MG tablet Take 1 tablet (500 mg total) by mouth 2 (two) times daily. 09/23/21   Renne Crigler, PA-C  ondansetron (ZOFRAN-ODT) 4 MG disintegrating tablet 4mg  ODT q4 hours prn  nausea/vomit 10/02/21   10/04/21, MD  penicillin v potassium (VEETID) 500 MG tablet Take 1 tablet (500 mg total) by mouth 3 (three) times daily. 09/23/21   09/25/21, PA-C      Allergies    Shellfish allergy, Other, and Tilactase    Review of Systems   Review of Systems  Constitutional:  Negative for chills and fever.  HENT:  Negative for facial swelling.   Eyes:  Negative for redness.  Respiratory:  Negative for shortness of breath, wheezing and stridor.   Cardiovascular:  Negative for chest pain, palpitations and leg swelling.  Gastrointestinal:  Positive for abdominal pain. Negative for constipation, diarrhea and vomiting.  Musculoskeletal:  Positive for back pain.    Physical Exam Updated Vital Signs BP (!) 131/91   Pulse 83   Temp 98 F (36.7 C) (Oral)   Resp 18   Ht 5\' 1"  (1.549 m)   Wt 81.2 kg   LMP 09/15/2021   SpO2 98%   BMI 33.82 kg/m  Physical Exam Vitals and nursing note reviewed. Exam conducted with a chaperone present.  Constitutional:      General: She is not in acute distress.    Appearance: She is well-developed.  HENT:     Head: Normocephalic and atraumatic.     Nose: Nose normal.  Eyes:     Pupils: Pupils are equal, round, and reactive to light.  Cardiovascular:     Rate and Rhythm:  Normal rate and regular rhythm.     Pulses: Normal pulses.     Heart sounds: Normal heart sounds.  Pulmonary:     Effort: Pulmonary effort is normal. No respiratory distress.     Breath sounds: Normal breath sounds.  Abdominal:     General: Bowel sounds are normal. There is no distension.     Palpations: Abdomen is soft.     Tenderness: There is no abdominal tenderness. There is no guarding or rebound.  Genitourinary:    Vagina: No vaginal discharge.  Musculoskeletal:        General: Normal range of motion.     Cervical back: Neck supple.  Skin:    General: Skin is dry.     Capillary Refill: Capillary refill takes less than 2 seconds.      Findings: No erythema or rash.  Neurological:     General: No focal deficit present.     Deep Tendon Reflexes: Reflexes normal.  Psychiatric:        Mood and Affect: Mood normal.     ED Results / Procedures / Treatments   Labs (all labs ordered are listed, but only abnormal results are displayed) Labs Reviewed - No data to display  EKG  EKG Interpretation  Date/Time:  Friday October 17 2021 03:59:48 EDT Ventricular Rate:  83 PR Interval:  167 QRS Duration: 100 QT Interval:  378 QTC Calculation: 445 R Axis:   75 Text Interpretation: Sinus rhythm Confirmed by Nicanor Alcon, Vern Guerette ([id number]) on 10/17/2021 4:35:45 AM         Radiology No results found.  Procedures Procedures    Medications Ordered in ED Medications  haloperidol lactate (HALDOL) injection 2 mg (0 mg Intramuscular Hold 10/17/21 0404)  ketorolac (TORADOL) injection 30 mg (has no administration in time range)  alum & mag hydroxide-simeth (MAALOX/MYLANTA) 200-200-20 MG/5ML suspension 30 mL (30 mLs Oral Given 10/17/21 0403)    ED Course/ Medical Decision Making/ A&P                           Medical Decision Making Amount and/or Complexity of Data Reviewed Radiology: ordered and independent interpretation performed.    Details: No pneumonia by me on cxr  ECG/medicine tests: ordered and independent interpretation performed. Decision-making details documented in ED Course.  Risk OTC drugs. Prescription drug management. Risk Details: Patient is well appearing.  Exam and vitals are benign and reassuring.  I suspect the patient had GERD and then started to panic and came in.  No elevation in heart rate.  I do not believe this is cardiac or pulmonary.  PERC negative wells 0, highly doubt PE in this low risk patient.  I have treated for GERD and musculoskeletal back pain.  XRay is normal.  Will start carafate, and medication for back pain.  Follow up with PMD.  Strict return     Final Clinical Impression(s) / ED  Diagnoses Final diagnoses:  None   Return for intractable cough, coughing up blood, fevers > 100.4 unrelieved by medication, shortness of breath, intractable vomiting, chest pain, shortness of breath, weakness, numbness, changes in speech, facial asymmetry, abdominal pain, passing out, Inability to tolerate liquids or food, cough, altered mental status or any concerns. No signs of systemic illness or infection. The patient is nontoxic-appearing on exam and vital signs are within normal limits.  I have reviewed the triage vital signs and the nursing notes. Pertinent labs & imaging results that were available  during my care of the patient were reviewed by me and considered in my medical decision making (see chart for details). After history, exam, and medical workup I feel the patient has been appropriately medically screened and is safe for discharge home. Pertinent diagnoses were discussed with the patient. Patient was given return precautions.  Rx / DC Orders ED Discharge Orders     None         Estella Malatesta, MD 10/17/21 [phone]

## 2021-11-02 ENCOUNTER — Other Ambulatory Visit: Payer: Self-pay

## 2021-11-02 ENCOUNTER — Encounter (HOSPITAL_BASED_OUTPATIENT_CLINIC_OR_DEPARTMENT_OTHER): Payer: Self-pay | Admitting: Emergency Medicine

## 2021-11-02 ENCOUNTER — Emergency Department (HOSPITAL_BASED_OUTPATIENT_CLINIC_OR_DEPARTMENT_OTHER)
Admission: EM | Admit: 2021-11-02 | Discharge: 2021-11-02 | Disposition: A | Discharge status: Home / Self Care | Payer: BC Managed Care – PPO | Attending: Emergency Medicine | Admitting: Emergency Medicine

## 2021-11-02 DIAGNOSIS — Z7951 Long term (current) use of inhaled steroids: Secondary | ICD-10-CM | POA: Insufficient documentation

## 2021-11-02 DIAGNOSIS — R209 Unspecified disturbances of skin sensation: Secondary | ICD-10-CM | POA: Diagnosis not present

## 2021-11-02 DIAGNOSIS — J45909 Unspecified asthma, uncomplicated: Secondary | ICD-10-CM | POA: Diagnosis not present

## 2021-11-02 DIAGNOSIS — F410 Panic disorder [episodic paroxysmal anxiety] without agoraphobia: Secondary | ICD-10-CM | POA: Diagnosis not present

## 2021-11-02 DIAGNOSIS — R251 Tremor, unspecified: Secondary | ICD-10-CM | POA: Insufficient documentation

## 2021-11-02 DIAGNOSIS — R002 Palpitations: Secondary | ICD-10-CM | POA: Diagnosis present

## 2021-11-02 HISTORY — DX: Gastro-esophageal reflux disease without esophagitis: K21.9

## 2021-11-02 NOTE — ED Triage Notes (Signed)
Pt states she woke up around 0040 feeling weak in BIL LE and L arm. She also felt like her heart was racing. When asked if she felt better now, pt states " a little bit, its not as fast as it was". Pt HR 67 on arrival. Oxygen 100%.

## 2021-11-02 NOTE — ED Provider Notes (Signed)
[place] DE[facility] Provider Note: [name], MD, FACEP  CSN: [id number] MRN: [id number] ARRIVAL: 11/02/21 at [place]: [place]  Palpitations   HISTORY OF PRESENT ILLNESS  11/02/21 2:33 AM Michele Jennings is a 26 y.o. female who awakened about 12:40 AM today with generalized shaking.  She also felt numbness in her left arm and legs but cannot remember if she felt numbness in her right arm.  She also felt like her heart was racing and she was breathing rapidly.  She also has felt some soreness in her legs.  She has had panic attacks in the past but states that this was worse and somewhat different than any panic attack she has had.  Her symptoms lasted about 5 or 10 minutes.  She is asymptomatic at the present time.  On arrival her heart rate was noted to be 67%.   Past Medical History:  Diagnosis Date   Acid reflux    Asthma    Eczema    Seasonal allergies     Past Surgical History:  Procedure Laterality Date   lump removed from left leg.      Family History  Family history unknown: Yes    Social History   Tobacco Use   Smoking status: Never   Smokeless tobacco: Never  Vaping Use   Vaping Use: Never used  Substance Use Topics   Alcohol use: No   Drug use: No    Prior to Admission medications   Medication Sig Start Date End Date Taking? Authorizing Provider  albuterol (PROVENTIL) (2.5 MG/3ML) 0.083% nebulizer solution Take 3 mLs (2.5 mg total) by nebulization every 4 (four) hours as needed for wheezing or shortness of breath. 02/07/21   Katalia Choma, MD  albuterol (VENTOLIN HFA) 108 (90 Base) MCG/ACT inhaler Inhale 1-2 puffs into the lungs every 6 (six) hours as needed for wheezing. 02/06/21   Hayden Rasmussen, MD  Multiple Vitamin (MULTIVITAMIN) tablet Take 1 tablet by mouth daily.    [provider]  naproxen (NAPROSYN) 500 MG tablet Take 1 tablet (500 mg total) by mouth 2 (two) times daily with a meal. 10/17/21   Palumbo,  April, MD  ondansetron (ZOFRAN-ODT) 4 MG disintegrating tablet 4mg  ODT q4 hours prn nausea/vomit 10/02/21   Drenda Freeze, MD  sucralfate (CARAFATE) 1 GM/10ML suspension Take 10 mLs (1 g total) by mouth 4 (four) times daily -  with meals and at bedtime. 10/17/21   Palumbo, April, MD    Allergies Shellfish allergy, Other, and Tilactase   REVIEW OF SYSTEMS  Negative except as noted here or in the History of Present Illness.   PHYSICAL EXAMINATION  Initial Vital Signs Blood pressure 115/83, pulse 67, temperature 98.4 F (36.9 C), temperature source Oral, resp. rate 17, height 5\' 1"  (1.549 m), weight 81.6 kg, last menstrual period 10/10/2021, SpO2 100 %.  Examination General: Well-developed, well-nourished female in no acute distress; appearance consistent with age of record HENT: normocephalic; atraumatic Eyes: pupils equal, round and reactive to light; extraocular muscles intact Neck: supple Heart: regular rate and rhythm Lungs: clear to auscultation bilaterally Abdomen: soft; nondistended; nontender; no masses or hepatosplenomegaly; bowel sounds present Extremities: No deformity; full range of motion; pulses normal Neurologic: Awake, alert and oriented; motor function intact in all extremities and symmetric; no facial droop; normal coordination and speech; normal finger-to-nose Skin: Warm and dry Psychiatric: Normal mood and affect   RESULTS  Summary of this visit's results, reviewed and interpreted  by myself:   EKG Interpretation  Date/Time:  Sunday November 02 2021 01:19:16 EDT Ventricular Rate:  62 PR Interval:  144 QRS Duration: 89 QT Interval:  396 QTC Calculation: 403 R Axis:   71 Text Interpretation: Sinus rhythm Normal ECG No significant change was found Confirmed by Shanon Rosser [phone]) on 11/02/2021 2:34:31 AM       Laboratory Studies: No results found for this or any previous visit (from the past 24 hour(s)). Imaging Studies: No results found.  ED  COURSE and MDM  Nursing notes, initial and subsequent vitals signs, including pulse oximetry, reviewed and interpreted by myself.  Vitals:   11/02/21 0101 11/02/21 0103 11/02/21 0104  BP:   115/83  Pulse: 67    Resp: 17    Temp: 98.4 F (36.9 C)    TempSrc: Oral    SpO2: 100%    Weight:  81.6 kg   Height:  5\' 1"  (1.549 m)    Medications - No data to display  The patient's symptoms are consistent with a panic attack although this can be diagnosis of exclusion.  There is no focal neurologic deficit on exam and her EKG is normal.  She does have a PCP with whom she can follow-up.  PROCEDURES  Procedures   ED DIAGNOSES     ICD-10-CM   1. Panic attack  F41.0          Kamilla Hands, Jenny Reichmann, MD 11/02/21 [phone]

## 2021-11-04 ENCOUNTER — Encounter (HOSPITAL_BASED_OUTPATIENT_CLINIC_OR_DEPARTMENT_OTHER): Payer: Self-pay | Admitting: Emergency Medicine

## 2021-11-04 ENCOUNTER — Emergency Department (HOSPITAL_BASED_OUTPATIENT_CLINIC_OR_DEPARTMENT_OTHER): Payer: BC Managed Care – PPO

## 2021-11-04 ENCOUNTER — Emergency Department (HOSPITAL_BASED_OUTPATIENT_CLINIC_OR_DEPARTMENT_OTHER)
Admission: EM | Admit: 2021-11-04 | Discharge: 2021-11-04 | Disposition: A | Discharge status: Home / Self Care | Payer: BC Managed Care – PPO | Attending: Emergency Medicine | Admitting: Emergency Medicine

## 2021-11-04 ENCOUNTER — Other Ambulatory Visit: Payer: Self-pay

## 2021-11-04 DIAGNOSIS — J069 Acute upper respiratory infection, unspecified: Secondary | ICD-10-CM | POA: Diagnosis not present

## 2021-11-04 DIAGNOSIS — J45909 Unspecified asthma, uncomplicated: Secondary | ICD-10-CM | POA: Diagnosis not present

## 2021-11-04 DIAGNOSIS — B9789 Other viral agents as the cause of diseases classified elsewhere: Secondary | ICD-10-CM | POA: Insufficient documentation

## 2021-11-04 DIAGNOSIS — Z20822 Contact with and (suspected) exposure to covid-19: Secondary | ICD-10-CM | POA: Insufficient documentation

## 2021-11-04 DIAGNOSIS — R059 Cough, unspecified: Secondary | ICD-10-CM | POA: Diagnosis present

## 2021-11-04 LAB — SARS CORONAVIRUS 2 BY RT PCR: SARS Coronavirus 2 by RT PCR: NEGATIVE

## 2021-11-04 MED ORDER — ALBUTEROL SULFATE HFA 108 (90 BASE) MCG/ACT IN AERS
2.0000 | INHALATION_SPRAY | RESPIRATORY_TRACT | Status: DC | PRN
  Administered 2021-11-04: 2 via RESPIRATORY_TRACT
  Filled 2021-11-04: qty 6.7

## 2021-11-04 MED ORDER — PREDNISONE 20 MG PO TABS
40.0000 mg | ORAL_TABLET | Freq: Once | ORAL | Status: AC
  Administered 2021-11-04: 40 mg via ORAL
  Filled 2021-11-04: qty 2

## 2021-11-04 MED ORDER — PREDNISONE 20 MG PO TABS: 40.0000 mg | ORAL_TABLET | Freq: Every day | ORAL | 0 refills | Status: DC

## 2021-11-04 NOTE — ED Triage Notes (Signed)
Cough with associated SOB since yesterday.

## 2021-11-04 NOTE — ED Provider Notes (Signed)
[facility] EMERGENCY DEPARTMENT Provider Note   CSN: [id number] Arrival date & time: 11/04/21  9702     History  Chief Complaint  Patient presents with   Cough    Michele Jennings is a 26 y.o. female.   Cough Patient presents with cough.  Some shortness of breath.  Began yesterday.  Has had some mild green sputum production.  Some aches and pains but not actually fever.  History of asthma.  States she has been using her nebulizer without much relief.  No known sick contacts.    Past Medical History:  Diagnosis Date   Acid reflux    Asthma    Eczema    Seasonal allergies     Home Medications Prior to Admission medications   Medication Sig Start Date End Date Taking? Authorizing Provider  predniSONE (DELTASONE) 20 MG tablet Take 2 tablets (40 mg total) by mouth daily. 11/04/21  Yes Benjiman Core, MD  albuterol (PROVENTIL) (2.5 MG/3ML) 0.083% nebulizer solution Take 3 mLs (2.5 mg total) by nebulization every 4 (four) hours as needed for wheezing or shortness of breath. 02/07/21   Molpus, John, MD  albuterol (VENTOLIN HFA) 108 (90 Base) MCG/ACT inhaler Inhale 1-2 puffs into the lungs every 6 (six) hours as needed for wheezing. 02/06/21   Terrilee Files, MD  Multiple Vitamin (MULTIVITAMIN) tablet Take 1 tablet by mouth daily.    [provider]  naproxen (NAPROSYN) 500 MG tablet Take 1 tablet (500 mg total) by mouth 2 (two) times daily with a meal. 10/17/21   Palumbo, April, MD  ondansetron (ZOFRAN-ODT) 4 MG disintegrating tablet 4mg  ODT q4 hours prn nausea/vomit 10/02/21   10/04/21, MD  sucralfate (CARAFATE) 1 GM/10ML suspension Take 10 mLs (1 g total) by mouth 4 (four) times daily -  with meals and at bedtime. 10/17/21   Palumbo, April, MD      Allergies    Shellfish allergy, Other, and Tilactase    Review of Systems   Review of Systems  Respiratory:  Positive for cough.     Physical Exam Updated Vital Signs BP 121/71   Pulse 97   Temp  99.9 F (37.7 C) (Oral)   Resp 18   Ht 5\' 1"  (1.549 m)   Wt 81.6 kg   LMP 10/10/2021   SpO2 97%   BMI 34.01 kg/m  Physical Exam Vitals and nursing note reviewed.  HENT:     Head: Atraumatic.  Eyes:     Pupils: Pupils are equal, round, and reactive to light.  Cardiovascular:     Rate and Rhythm: Regular rhythm.  Pulmonary:     Breath sounds: Wheezing present.  Abdominal:     Tenderness: There is no abdominal tenderness.  Musculoskeletal:        General: No tenderness.  Skin:    General: Skin is warm.  Neurological:     Mental Status: She is alert.     ED Results / Procedures / Treatments   Labs (all labs ordered are listed, but only abnormal results are displayed) Labs Reviewed  SARS CORONAVIRUS 2 BY RT PCR    EKG None  Radiology DG Chest 2 View  Result Date: 11/04/2021 CLINICAL DATA:  26 year old female with history of cough and shortness of breath. EXAM: CHEST - 2 VIEW COMPARISON:  Chest x-ray 10/17/2021. FINDINGS: Lung volumes are normal. No consolidative airspace disease. No pleural effusions. No pneumothorax. No pulmonary nodule or mass noted. Pulmonary vasculature and the cardiomediastinal silhouette  are within normal limits. IMPRESSION: No radiographic evidence of acute cardiopulmonary disease. Electronically Signed   By: Vinnie Langton M.D.   On: 11/04/2021 07:29    Procedures Procedures    Medications Ordered in ED Medications  albuterol (VENTOLIN HFA) 108 (90 Base) MCG/ACT inhaler 2 puff (2 puffs Inhalation Given 11/04/21 0900)  predniSONE (DELTASONE) tablet 40 mg (40 mg Oral Given 11/04/21 0900)    ED Course/ Medical Decision Making/ A&P                           Medical Decision Making Amount and/or Complexity of Data Reviewed Radiology: ordered.  Risk Prescription drug management.  Patient with URI symptoms.  Has had for the last couple days.  X-ray does not show pneumonia.  Physical exam does show some wheezes but diffuse.  Not hypoxic.   Do not think this is pneumonia.  Most likely URI and mild asthma exacerbation.  Has nebulizer at home but we will refill her albuterol.  We will give steroids to help with the likely asthma exacerbation secondary to the virus.  Appears stable for discharge home.        Final Clinical Impression(s) / ED Diagnoses Final diagnoses:  Viral URI with cough    Rx / DC Orders ED Discharge Orders          Ordered    predniSONE (DELTASONE) 20 MG tablet  Daily        11/04/21 0929              Davonna Belling, MD 11/04/21 [phone]

## 2021-12-12 DIAGNOSIS — K5900 Constipation, unspecified: Secondary | ICD-10-CM

## 2021-12-12 DIAGNOSIS — R0789 Other chest pain: Secondary | ICD-10-CM | POA: Insufficient documentation

## 2021-12-12 DIAGNOSIS — M79622 Pain in left upper arm: Secondary | ICD-10-CM | POA: Insufficient documentation

## 2021-12-12 HISTORY — DX: Pain in left upper arm: M79.622

## 2021-12-12 HISTORY — DX: Other chest pain: R07.89

## 2021-12-12 HISTORY — DX: Constipation, unspecified: K59.00

## 2021-12-25 ENCOUNTER — Encounter (HOSPITAL_BASED_OUTPATIENT_CLINIC_OR_DEPARTMENT_OTHER): Payer: Self-pay | Admitting: Emergency Medicine

## 2021-12-25 ENCOUNTER — Other Ambulatory Visit: Payer: Self-pay

## 2021-12-25 ENCOUNTER — Emergency Department (HOSPITAL_BASED_OUTPATIENT_CLINIC_OR_DEPARTMENT_OTHER): Payer: BC Managed Care – PPO

## 2021-12-25 ENCOUNTER — Emergency Department (HOSPITAL_BASED_OUTPATIENT_CLINIC_OR_DEPARTMENT_OTHER)
Admission: EM | Admit: 2021-12-25 | Discharge: 2021-12-25 | Disposition: A | Discharge status: Home / Self Care | Payer: BC Managed Care – PPO | Attending: Emergency Medicine | Admitting: Emergency Medicine

## 2021-12-25 DIAGNOSIS — R110 Nausea: Secondary | ICD-10-CM | POA: Diagnosis not present

## 2021-12-25 DIAGNOSIS — R079 Chest pain, unspecified: Secondary | ICD-10-CM | POA: Insufficient documentation

## 2021-12-25 LAB — CBC
HCT: 33.9 % — ABNORMAL LOW (ref 36.0–46.0)
Hemoglobin: 10.9 g/dL — ABNORMAL LOW (ref 12.0–15.0)
MCH: 27.9 pg (ref 26.0–34.0)
MCHC: 32.2 g/dL (ref 30.0–36.0)
MCV: 86.7 fL (ref 80.0–100.0)
Platelets: 210 10*3/uL (ref 150–400)
RBC: 3.91 MIL/uL (ref 3.87–5.11)
RDW: 13.9 % (ref 11.5–15.5)
WBC: 5.1 10*3/uL (ref 4.0–10.5)
nRBC: 0 % (ref 0.0–0.2)

## 2021-12-25 LAB — BASIC METABOLIC PANEL
Anion gap: 7 (ref 5–15)
BUN: 13 mg/dL (ref 6–20)
CO2: 24 mmol/L (ref 22–32)
Calcium: 8.1 mg/dL — ABNORMAL LOW (ref 8.9–10.3)
Chloride: 105 mmol/L (ref 98–111)
Creatinine, Ser: 0.8 mg/dL (ref 0.44–1.00)
GFR, Estimated: >60 mL/min (ref >60)
Glucose, Bld: 91 mg/dL (ref 70–99)
Potassium: 4 mmol/L (ref 3.5–5.1)
Sodium: 136 mmol/L (ref 135–145)

## 2021-12-25 LAB — PREGNANCY, URINE: Preg Test, Ur: NEGATIVE

## 2021-12-25 LAB — TROPONIN I (HIGH SENSITIVITY): Troponin I (High Sensitivity): <2 ng/L (ref <18)

## 2021-12-25 MED ORDER — ONDANSETRON 4 MG PO TBDP: ORAL_TABLET | ORAL | 0 refills | Status: DC

## 2021-12-25 MED ORDER — ONDANSETRON 4 MG PO TBDP
4.0000 mg | ORAL_TABLET | Freq: Once | ORAL | Status: AC
  Administered 2021-12-25: 4 mg via ORAL
  Filled 2021-12-25: qty 1

## 2021-12-25 NOTE — ED Provider Notes (Signed)
[facility] EMERGENCY DEPARTMENT Provider Note   CSN: [id number] Arrival date & time: 12/25/21  1810     History  Chief Complaint  Patient presents with   Chest Pain    Michele Jennings is a 26 y.o. female here presenting with chest pain.  Patient has a history of gastric reflux.  Patient states that she felt nauseated and has worsening chest pain today.  Patient states that this is different than her usual reflux symptoms.  She takes omeprazole for her reflux.  She has no recent travel and no history of CAD or stents.  The history is provided by the patient.       Home Medications Prior to Admission medications   Medication Sig Start Date End Date Taking? Authorizing Provider  albuterol (PROVENTIL) (2.5 MG/3ML) 0.083% nebulizer solution Take 3 mLs (2.5 mg total) by nebulization every 4 (four) hours as needed for wheezing or shortness of breath. 02/07/21   Molpus, John, MD  albuterol (VENTOLIN HFA) 108 (90 Base) MCG/ACT inhaler Inhale 1-2 puffs into the lungs every 6 (six) hours as needed for wheezing. 02/06/21   Terrilee Files, MD  Multiple Vitamin (MULTIVITAMIN) tablet Take 1 tablet by mouth daily.    [provider]  naproxen (NAPROSYN) 500 MG tablet Take 1 tablet (500 mg total) by mouth 2 (two) times daily with a meal. 10/17/21   Palumbo, April, MD  ondansetron (ZOFRAN-ODT) 4 MG disintegrating tablet 4mg  ODT q4 hours prn nausea/vomit 10/02/21   10/04/21, MD  predniSONE (DELTASONE) 20 MG tablet Take 2 tablets (40 mg total) by mouth daily. 11/04/21   11/06/21, MD  sucralfate (CARAFATE) 1 GM/10ML suspension Take 10 mLs (1 g total) by mouth 4 (four) times daily -  with meals and at bedtime. 10/17/21   Palumbo, April, MD      Allergies    Shellfish allergy, Milk protein, Other, and Tilactase    Review of Systems   Review of Systems  Cardiovascular:  Positive for chest pain.  All other systems reviewed and are negative.   Physical  Exam Updated Vital Signs BP 114/76   Pulse 76   Temp 97.6 F (36.4 C)   Resp 18   Ht 5\' 1"  (1.549 m)   Wt 79.4 kg   LMP 12/07/2021 (Approximate)   SpO2 98%   BMI 33.07 kg/m  Physical Exam Vitals and nursing note reviewed.  Constitutional:      Comments: Nauseated and crunched up and slightly uncomfortable  HENT:     Head: Normocephalic.  Eyes:     Extraocular Movements: Extraocular movements intact.     Pupils: Pupils are equal, round, and reactive to light.  Cardiovascular:     Rate and Rhythm: Normal rate and regular rhythm.     Heart sounds: Normal heart sounds.  Pulmonary:     Effort: Pulmonary effort is normal.     Breath sounds: Normal breath sounds.  Abdominal:     General: Bowel sounds are normal.     Palpations: Abdomen is soft.  Musculoskeletal:        General: Normal range of motion.     Cervical back: Normal range of motion and neck supple.  Skin:    General: Skin is warm.     Capillary Refill: Capillary refill takes less than 2 seconds.  Neurological:     General: No focal deficit present.     Mental Status: She is alert and oriented to person, place, and time.  Psychiatric:        Mood and Affect: Mood normal.        Behavior: Behavior normal.     ED Results / Procedures / Treatments   Labs (all labs ordered are listed, but only abnormal results are displayed) Labs Reviewed  BASIC METABOLIC PANEL - Abnormal; Notable for the following components:      Result Value   Calcium 8.1 (*)    All other components within normal limits  CBC - Abnormal; Notable for the following components:   Hemoglobin 10.9 (*)    HCT 33.9 (*)    All other components within normal limits  PREGNANCY, URINE  TROPONIN I (HIGH SENSITIVITY)  TROPONIN I (HIGH SENSITIVITY)    EKG EKG Interpretation  Date/Time:  Thursday December 25 2021 18:29:05 EST Ventricular Rate:  75 PR Interval:  144 QRS Duration: 82 QT Interval:  382 QTC Calculation: 426 R Axis:   64 Text  Interpretation: Normal sinus rhythm Normal ECG When compared with ECG of 04-Nov-2021 07:08, PREVIOUS ECG IS PRESENT Confirmed by Richardean Canal [phone]) on 12/25/2021 7:52:14 PM  Radiology DG Chest 2 View  Result Date: 12/25/2021 CLINICAL DATA:  Chest pain and nausea EXAM: CHEST - 2 VIEW COMPARISON:  Chest 11/04/2021 FINDINGS: The heart size and mediastinal contours are within normal limits. Both lungs are clear. The visualized skeletal structures are unremarkable. IMPRESSION: No active cardiopulmonary disease. Electronically Signed   By: Marlan Palau M.D.   On: 12/25/2021 18:48    Procedures Procedures    Medications Ordered in ED Medications  ondansetron (ZOFRAN-ODT) disintegrating tablet 4 mg (has no administration in time range)    ED Course/ Medical Decision Making/ A&P                           Medical Decision Making Michele Jennings is a 26 y.o. female here presenting with nausea and chest pain.  I have low suspicion for ACS.  I think likely reflux or viral gastroenteritis.  Her initial troponin is negative.  Her vitals are stable.  Chest x-ray is clear.  Patient felt better after Zofran.  I told her that if she has persistent pain then she can follow-up with GI or cardiology for further assessment.   Problems Addressed: Chest pain, unspecified type: acute illness or injury  Amount and/or Complexity of Data Reviewed Labs: ordered. Decision-making details documented in ED Course. Radiology: ordered and independent interpretation performed. Decision-making details documented in ED Course.  Risk Prescription drug management.    Final Clinical Impression(s) / ED Diagnoses Final diagnoses:  None    Rx / DC Orders ED Discharge Orders     None         Charlynne Pander, MD 12/25/21 2042

## 2021-12-25 NOTE — ED Notes (Signed)
Called pt   No response from lobby  

## 2021-12-25 NOTE — ED Triage Notes (Signed)
Central chest pain and nausea that started today. Denies radiation. Reports hx of anxiety and acid reflux.

## 2021-12-25 NOTE — Discharge Instructions (Signed)
Take zofran for nausea   Continue omeprazole   See your GI doctor if you have worse nausea or abdominal pain   See cardiology if you have worse chest pain   Return to ER if you have worse chest pain, shortness of breath

## 2021-12-26 ENCOUNTER — Emergency Department (HOSPITAL_COMMUNITY)
Admission: EM | Admit: 2021-12-26 | Discharge: 2021-12-26 | Disposition: A | Discharge status: Home / Self Care | Payer: BC Managed Care – PPO | Attending: Emergency Medicine | Admitting: Emergency Medicine

## 2021-12-26 ENCOUNTER — Other Ambulatory Visit: Payer: Self-pay

## 2021-12-26 ENCOUNTER — Encounter (HOSPITAL_COMMUNITY): Payer: Self-pay

## 2021-12-26 DIAGNOSIS — Z20822 Contact with and (suspected) exposure to covid-19: Secondary | ICD-10-CM | POA: Diagnosis not present

## 2021-12-26 DIAGNOSIS — J069 Acute upper respiratory infection, unspecified: Secondary | ICD-10-CM | POA: Diagnosis not present

## 2021-12-26 DIAGNOSIS — M791 Myalgia, unspecified site: Secondary | ICD-10-CM | POA: Diagnosis present

## 2021-12-26 LAB — RESP PANEL BY RT-PCR (FLU A&B, COVID) ARPGX2
Influenza A by PCR: POSITIVE — AB
Influenza B by PCR: NEGATIVE
SARS Coronavirus 2 by RT PCR: NEGATIVE

## 2021-12-26 NOTE — ED Provider Triage Note (Signed)
Emergency Medicine Provider Triage Evaluation Note  Michele Jennings , a 26 y.o. female  was evaluated in triage.  Pt complains of covid exposure and continued sharp stabbing chest pain of left chest wall.  Pt complains of URI symptoms and body aches starting Sunday.  Exposed to mother two days ago who tested positive for covid.  Still with diarrhea.  Had extensive chest pain workup yesterday which was negative.  Was given zofran with improvement.  Had been recommended to follow up with GI or cardiology for further assessment if lingering.  Review of Systems  Positive:  Negative: See above  Physical Exam  BP 120/89 (BP Location: Right Arm)   Pulse 85   Temp 98.1 F (36.7 C) (Oral)   Resp 18   Ht 5\' 1"  (1.549 m)   Wt 79.4 kg   LMP 12/07/2021 (Approximate)   SpO2 99%   BMI 33.07 kg/m  Gen:   Awake, no distress   Resp:  Normal effort, equal chest rise MSK:   Moves extremities without difficulty  Other:  Abdomen soft, non-TTP.  Localized subjective pain of the left upper chest wall about 1 to 2 cm below the clavicle  Medical Decision Making  Medically screening exam initiated at 11:05 AM.  Appropriate orders placed.  Michele Jennings was informed that the remainder of the evaluation will be completed by another provider, this initial triage assessment does not replace that evaluation, and the importance of remaining in the ED until their evaluation is complete.     Rosezetta Schlatter, PA-C 12/26/21 1112

## 2021-12-26 NOTE — ED Triage Notes (Signed)
Pt arrived POV from home c/o generalized body aches, N/V/D. Pt was seen yesterday for CP and had a CP work up down that was negative. SO patient states she thinks it might be covid.

## 2021-12-26 NOTE — ED Notes (Signed)
Covid test sent to lab.

## 2021-12-26 NOTE — ED Provider Notes (Signed)
[facility] EMERGENCY DEPARTMENT Provider Note   CSN: [id number] Arrival date & time: 12/26/21  1041     History Chief Complaint  Patient presents with   Generalized Body Aches    HPI ANALEI DORCELY is a 26 y.o. female presenting for chief complaint of generalized body aches.  She endorses 4 days of nausea, myalgias and a COVID exposure earlier this week.  She has been seen multiple times for similar, grossly reassuring work-ups this week.  States that the fever recurred last night.  It responded to Tylenol and is now resolved.  She had a 2-hour wait in the waiting room and symptoms are stable at this time.  She denies nausea, vomiting, syncope or shortness of breath at this time..   Patient's recorded medical, surgical, social, medication list and allergies were reviewed in the Snapshot window as part of the initial history.   Review of Systems   Review of Systems  Constitutional:  Positive for fatigue. Negative for chills and fever.  HENT:  Negative for ear pain and sore throat.   Eyes:  Negative for pain and visual disturbance.  Respiratory:  Negative for cough and shortness of breath.   Cardiovascular:  Negative for chest pain and palpitations.  Gastrointestinal:  Negative for abdominal pain and vomiting.  Genitourinary:  Negative for dysuria and hematuria.  Musculoskeletal:  Positive for myalgias. Negative for arthralgias and back pain.  Skin:  Negative for color change and rash.  Neurological:  Negative for seizures and syncope.  All other systems reviewed and are negative.   Physical Exam Updated Vital Signs BP 127/81 (BP Location: Right Arm)   Pulse 75   Temp 98.2 F (36.8 C) (Oral)   Resp 16   Ht 5\' 1"  (1.549 m)   Wt 79.4 kg   LMP 12/07/2021 (Approximate)   SpO2 100%   BMI 33.07 kg/m  Physical Exam Vitals and nursing note reviewed.  Constitutional:      General: She is not in acute distress.    Appearance: She is well-developed.  HENT:      Head: Normocephalic and atraumatic.  Eyes:     Conjunctiva/sclera: Conjunctivae normal.  Cardiovascular:     Rate and Rhythm: Normal rate and regular rhythm.     Heart sounds: No murmur heard. Pulmonary:     Effort: Pulmonary effort is normal. No respiratory distress.     Breath sounds: Normal breath sounds.  Abdominal:     General: There is no distension.     Palpations: Abdomen is soft.     Tenderness: There is no abdominal tenderness. There is no right CVA tenderness or left CVA tenderness.  Musculoskeletal:        General: No swelling or tenderness. Normal range of motion.     Cervical back: Neck supple.  Skin:    General: Skin is warm and dry.  Neurological:     General: No focal deficit present.     Mental Status: She is alert and oriented to person, place, and time. Mental status is at baseline.     Cranial Nerves: No cranial nerve deficit.      ED Course/ Medical Decision Making/ A&P    Procedures Procedures   Medications Ordered in ED Medications - No data to display Medical Decision Making:   SAMYAH BREELAND is a 26 y.o. female who presented to the ED today with subjective fever, cough, congestion detailed above.    Patient's presentation is complicated by their history  of multiple comorbid medical problems.  Patient placed on continuous vitals and telemetry monitoring while in ED which was reviewed periodically.   Complete initial physical exam performed, notably the patient  was hemodynamically stable in no acute distress.  Posterior oropharynx illuminated and without obvious swelling or deformity.  Patient is without neck stiffness.    Reviewed and confirmed nursing documentation for past medical history, family history, social history.    Initial Assessment:   With the patient's presentation of fever cough congestion, most likely diagnosis is developing viral upper respiratory infection. Other diagnoses were considered including (but not limited to)  peritonsillar abscess, retropharyngeal abscess, pneumonia. These are considered less likely due to history of present illness and physical exam findings.   This is most consistent with an acute life/limb threatening illness complicated by underlying chronic conditions. Considered meningitis, however patient's symptoms, vital signs, physical exam findings including lack of meningismus seem grossly less consistent at this time. Initial Plan:  Viral screening including COVID/flu testing to evaluate for common viral etiologies that need to be tracked Objective evaluation as below reviewed   Initial Study Results:   On reassessment, patient is ambulatory tolerating p.o. intake in no acute distress.   Patient's flu test was positive.  She is outside the window for Tamiflu given for 6 days of symptoms now.  Discussed supportive care.  Given her well appearance, no acute indication for intervention at this time. Patient is currently stable for outpatient care and management with no indication for hospitalization or transfer at this time.  Discussed all findings with patient expressed understanding.  Disposition:  Based on the above findings, I believe patient is stable for discharge.    Patient/family educated about specific return precautions for given chief complaint and symptoms.  Patient/family educated about follow-up with PCP.     Patient/family expressed understanding of return precautions and need for follow-up. Patient spoken to regarding all imaging and laboratory results and appropriate follow up for these results. All education provided in verbal form with additional information in written form. Time was allowed for answering of patient questions. Patient discharged.    Emergency Department Medication Summary:   Medications - No data to display         Clinical Impression:  1. Upper respiratory tract infection, unspecified type      Discharge   Clinical Impression:  1. Upper  respiratory tract infection, unspecified type      Discharge   Final Clinical Impression(s) / ED Diagnoses Final diagnoses:  Upper respiratory tract infection, unspecified type    Rx / DC Orders ED Discharge Orders     None         Glyn Ade, MD 12/26/21 1600

## 2021-12-26 NOTE — ED Notes (Signed)
Quick Dispo: Chest pain, low HEART   Glyn Ade, MD 12/26/21 1216

## 2021-12-26 NOTE — Discharge Instructions (Signed)
You were seen today for gastrointestinal upset throughout the week this week.  You likely have been suffering from symptoms of the flu all week.  You tested positive today.  You are outside the treatment window for Tamiflu, however you would likely benefit from good hydration, you may look over-the-counter for Imodium or other antidiarrheals.  However I would recommend not taking these medications and just hydrating well to treat your diarrhea. Otherwise, you should be ready to return to work by Monday as your infectious window will be over.  Tylenol and Motrin as needed for fever and chills is also reasonable.  Please return with any changes in your symptoms, Glyn Ade MD

## 2022-01-08 ENCOUNTER — Other Ambulatory Visit: Payer: Self-pay

## 2022-01-08 DIAGNOSIS — L309 Dermatitis, unspecified: Secondary | ICD-10-CM | POA: Insufficient documentation

## 2022-01-08 DIAGNOSIS — J45909 Unspecified asthma, uncomplicated: Secondary | ICD-10-CM | POA: Insufficient documentation

## 2022-01-08 DIAGNOSIS — J302 Other seasonal allergic rhinitis: Secondary | ICD-10-CM | POA: Insufficient documentation

## 2022-01-08 DIAGNOSIS — K219 Gastro-esophageal reflux disease without esophagitis: Secondary | ICD-10-CM | POA: Insufficient documentation

## 2022-01-14 ENCOUNTER — Ambulatory Visit: Payer: BC Managed Care – PPO | Attending: Cardiology | Admitting: Cardiology

## 2022-01-23 ENCOUNTER — Encounter: Payer: Self-pay | Admitting: Cardiology

## 2022-02-15 ENCOUNTER — Emergency Department (HOSPITAL_BASED_OUTPATIENT_CLINIC_OR_DEPARTMENT_OTHER)
Admission: EM | Admit: 2022-02-15 | Discharge: 2022-02-15 | Disposition: A | Discharge status: Home / Self Care | Payer: BC Managed Care – PPO | Attending: Emergency Medicine | Admitting: Emergency Medicine

## 2022-02-15 ENCOUNTER — Encounter (HOSPITAL_BASED_OUTPATIENT_CLINIC_OR_DEPARTMENT_OTHER): Payer: Self-pay | Admitting: Emergency Medicine

## 2022-02-15 DIAGNOSIS — K5900 Constipation, unspecified: Secondary | ICD-10-CM | POA: Diagnosis not present

## 2022-02-15 DIAGNOSIS — R1030 Lower abdominal pain, unspecified: Secondary | ICD-10-CM | POA: Diagnosis present

## 2022-02-15 DIAGNOSIS — R110 Nausea: Secondary | ICD-10-CM

## 2022-02-15 LAB — WET PREP, GENITAL
Clue Cells Wet Prep HPF POC: NONE SEEN
Sperm: NONE SEEN
Trich, Wet Prep: NONE SEEN
WBC, Wet Prep HPF POC: <10 (ref <10)
Yeast Wet Prep HPF POC: NONE SEEN

## 2022-02-15 LAB — URINALYSIS, ROUTINE W REFLEX MICROSCOPIC
Bilirubin Urine: NEGATIVE
Glucose, UA: NEGATIVE mg/dL
Hgb urine dipstick: NEGATIVE
Ketones, ur: NEGATIVE mg/dL
Leukocytes,Ua: NEGATIVE
Nitrite: NEGATIVE
Protein, ur: NEGATIVE mg/dL
Specific Gravity, Urine: 1.01 (ref 1.005–1.030)
pH: 7 (ref 5.0–8.0)

## 2022-02-15 LAB — PREGNANCY, URINE: Preg Test, Ur: NEGATIVE

## 2022-02-15 MED ORDER — ONDANSETRON HCL 4 MG PO TABS: 4.0000 mg | ORAL_TABLET | Freq: Three times a day (TID) | ORAL | 0 refills | Status: DC | PRN

## 2022-02-15 MED ORDER — ONDANSETRON 4 MG PO TBDP
8.0000 mg | ORAL_TABLET | Freq: Once | ORAL | Status: AC
  Administered 2022-02-15: 8 mg via ORAL
  Filled 2022-02-15: qty 2

## 2022-02-15 NOTE — Discharge Instructions (Signed)
You can take magnesium citrate one time for constipation - this will cause cramping.   You can also try miralax, sennakot, or colace.

## 2022-02-15 NOTE — ED Provider Notes (Signed)
[facility] EMERGENCY DEPARTMENT Provider Note   CSN: [id number] Arrival date & time: 02/15/22  0032     History  Chief Complaint  Patient presents with   Chest Pain    Michele Jennings is a 27 y.o. female.  The history is provided by the patient and medical records.  Chest Pain Michele Jennings is a 27 y.o. female who presents to the Emergency Department complaining of nausea.  She presents to the emergency department for evaluation of nausea that started last night.  She reports associated suprapubic and pelvic discomfort that has been present since that time.  She also complains of left arm pain in the deltoid and shoulder area.  Her arm pain has been present for 2 years.  No significant chest pain.  She does have 2 to 3 days of vaginal discharge and does have a new partner.  She has a history of reflux, asthma.  LMP was mid December.  She has constipation, had a BM today but did require an enema to achieve this result.  She has experienced intermittent pelvic and lower abdominal pain for a while.     Home Medications Prior to Admission medications   Medication Sig Start Date End Date Taking? Authorizing Provider  ondansetron (ZOFRAN) 4 MG tablet Take 1 tablet (4 mg total) by mouth every 8 (eight) hours as needed for nausea or vomiting. 02/15/22  Yes Quintella Reichert, MD  albuterol (PROVENTIL) (2.5 MG/3ML) 0.083% nebulizer solution Take 3 mLs (2.5 mg total) by nebulization every 4 (four) hours as needed for wheezing or shortness of breath. 02/07/21   Molpus, John, MD  albuterol (VENTOLIN HFA) 108 (90 Base) MCG/ACT inhaler Inhale 1-2 puffs into the lungs every 6 (six) hours as needed for wheezing. 02/06/21   Hayden Rasmussen, MD  Multiple Vitamin (MULTIVITAMIN) tablet Take 1 tablet by mouth daily.    [provider]  naproxen (NAPROSYN) 500 MG tablet Take 1 tablet (500 mg total) by mouth 2 (two) times daily with a meal. 10/17/21   Palumbo, April, MD  ondansetron  (ZOFRAN-ODT) 4 MG disintegrating tablet 4mg  ODT q4 hours prn nausea/vomit 12/25/21   Drenda Freeze, MD  predniSONE (DELTASONE) 20 MG tablet Take 2 tablets (40 mg total) by mouth daily. 11/04/21   Davonna Belling, MD  sucralfate (CARAFATE) 1 GM/10ML suspension Take 10 mLs (1 g total) by mouth 4 (four) times daily -  with meals and at bedtime. 10/17/21   Palumbo, April, MD  SYMBICORT 80-4.5 MCG/ACT inhaler Inhale 2 puffs into the lungs 2 (two) times daily. 11/05/21   [provider]      Allergies    Shellfish allergy, Milk protein, Other, and Tilactase    Review of Systems   Review of Systems  Cardiovascular:  Positive for chest pain.  All other systems reviewed and are negative.   Physical Exam Updated Vital Signs BP (!) 145/95   Pulse 74   Temp 98.4 F (36.9 C) (Oral)   Resp 18   LMP 01/27/2022   SpO2 100%  Physical Exam Vitals and nursing note reviewed.  Constitutional:      Appearance: She is well-developed.  HENT:     Head: Normocephalic and atraumatic.  Cardiovascular:     Rate and Rhythm: Normal rate and regular rhythm.     Heart sounds: No murmur heard. Pulmonary:     Effort: Pulmonary effort is normal. No respiratory distress.     Breath sounds: Normal breath sounds.  Abdominal:  Palpations: Abdomen is soft.     Tenderness: There is no abdominal tenderness. There is no guarding or rebound.  Genitourinary:    General: Normal vulva.     Comments: Moderate thick white vaginal discharge. No cmt Musculoskeletal:        General: No tenderness.  Skin:    General: Skin is warm and dry.  Neurological:     Mental Status: She is alert and oriented to person, place, and time.  Psychiatric:        Behavior: Behavior normal.     ED Results / Procedures / Treatments   Labs (all labs ordered are listed, but only abnormal results are displayed) Labs Reviewed  WET PREP, GENITAL  URINALYSIS, ROUTINE W REFLEX MICROSCOPIC  PREGNANCY, URINE  GC/CHLAMYDIA  PROBE AMP ([facility]) NOT AT [facility]    EKG EKG Interpretation  Date/Time:  Sunday February 15 2022 01:46:41 EST Ventricular Rate:  75 PR Interval:  153 QRS Duration: 88 QT Interval:  378 QTC Calculation: 423 R Axis:   69 Text Interpretation: Sinus rhythm Confirmed by Rino Hosea ([id number]) on 02/15/2022 2:00:17 AM  Radiology No results found.  Procedures Procedures    Medications Ordered in ED Medications  ondansetron (ZOFRAN-ODT) disintegrating tablet 8 mg (8 mg Oral Given 02/15/22 0143)    ED Course/ Medical Decision Making/ A&P                           Medical Decision Making Amount and/or Complexity of Data Reviewed Labs: ordered.  Risk Prescription drug management.   Patient here for evaluation of abdominal pain, nausea.  Examination is benign with no significant abdominal tenderness.  She does have a new sexual partner.  UA is not consistent with UTI, U pregnant is negative.  She does have discharge on her pelvic examination but no evidence of PID, cervicitis, tubo-ovarian abscess or torsion.  Wet prep is negative for acute abnormality.  Discussed with patient developed symptomatic itching she may consider starting over-the-counter yeast medication.  Examination is not consistent with acute appendicitis, diverticulitis, pancreatitis.  Discussed home care for nausea as well as pelvic pain/abdominal pain.  Discussed home care for constipation, which is an ongoing issue for her.  Discussed outpatient follow-up and return precautions.        Final Clinical Impression(s) / ED Diagnoses Final diagnoses:  Nausea  Constipation, unspecified constipation type    Rx / DC Orders ED Discharge Orders          Ordered    ondansetron (ZOFRAN) 4 MG tablet  Every 8 hours PRN        01 /07/24 0246              03-07-1976, MD 02/15/22 [phone]

## 2022-02-15 NOTE — ED Triage Notes (Signed)
Pt got off work and started having left arm pain- squeezing pain in deltoid area. 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 She reports nauseated and feels weak. Denies SOB but reports heavy feeling in chest. Denies sore throat or fevers.

## 2022-02-16 LAB — GC/CHLAMYDIA PROBE AMP (~~LOC~~) NOT AT ARMC
Chlamydia: NEGATIVE
Comment: NEGATIVE
Comment: NORMAL
Neisseria Gonorrhea: NEGATIVE

## 2022-03-04 ENCOUNTER — Encounter (HOSPITAL_BASED_OUTPATIENT_CLINIC_OR_DEPARTMENT_OTHER): Payer: Self-pay

## 2022-03-04 ENCOUNTER — Emergency Department (HOSPITAL_BASED_OUTPATIENT_CLINIC_OR_DEPARTMENT_OTHER)
Admission: EM | Admit: 2022-03-04 | Discharge: 2022-03-04 | Disposition: A | Discharge status: Home / Self Care | Payer: BC Managed Care – PPO | Attending: Emergency Medicine | Admitting: Emergency Medicine

## 2022-03-04 DIAGNOSIS — Z7951 Long term (current) use of inhaled steroids: Secondary | ICD-10-CM | POA: Diagnosis not present

## 2022-03-04 DIAGNOSIS — R350 Frequency of micturition: Secondary | ICD-10-CM | POA: Diagnosis present

## 2022-03-04 DIAGNOSIS — J45909 Unspecified asthma, uncomplicated: Secondary | ICD-10-CM | POA: Insufficient documentation

## 2022-03-04 DIAGNOSIS — J111 Influenza due to unidentified influenza virus with other respiratory manifestations: Secondary | ICD-10-CM

## 2022-03-04 DIAGNOSIS — N3001 Acute cystitis with hematuria: Secondary | ICD-10-CM | POA: Diagnosis not present

## 2022-03-04 DIAGNOSIS — Z1152 Encounter for screening for COVID-19: Secondary | ICD-10-CM | POA: Diagnosis not present

## 2022-03-04 DIAGNOSIS — J101 Influenza due to other identified influenza virus with other respiratory manifestations: Secondary | ICD-10-CM | POA: Insufficient documentation

## 2022-03-04 LAB — URINALYSIS, ROUTINE W REFLEX MICROSCOPIC
Bilirubin Urine: NEGATIVE
Glucose, UA: NEGATIVE mg/dL
Ketones, ur: NEGATIVE mg/dL
Leukocytes,Ua: NEGATIVE
Nitrite: NEGATIVE
Protein, ur: NEGATIVE mg/dL
Specific Gravity, Urine: 1.015 (ref 1.005–1.030)
pH: 6.5 (ref 5.0–8.0)

## 2022-03-04 LAB — RESP PANEL BY RT-PCR (RSV, FLU A&B, COVID)  RVPGX2
Influenza A by PCR: NEGATIVE
Influenza B by PCR: POSITIVE — AB
Resp Syncytial Virus by PCR: NEGATIVE
SARS Coronavirus 2 by RT PCR: NEGATIVE

## 2022-03-04 LAB — URINALYSIS, MICROSCOPIC (REFLEX)

## 2022-03-04 LAB — PREGNANCY, URINE: Preg Test, Ur: NEGATIVE

## 2022-03-04 MED ORDER — OSELTAMIVIR PHOSPHATE 75 MG PO CAPS: 75.0000 mg | ORAL_CAPSULE | Freq: Two times a day (BID) | ORAL | 0 refills | Status: DC

## 2022-03-04 MED ORDER — NITROFURANTOIN MONOHYD MACRO 100 MG PO CAPS: 100.0000 mg | ORAL_CAPSULE | Freq: Two times a day (BID) | ORAL | 0 refills | Status: DC

## 2022-03-04 MED ORDER — NITROFURANTOIN MONOHYD MACRO 100 MG PO CAPS
100.0000 mg | ORAL_CAPSULE | Freq: Once | ORAL | Status: AC
  Administered 2022-03-04: 100 mg via ORAL
  Filled 2022-03-04: qty 1

## 2022-03-04 MED ORDER — IBUPROFEN 400 MG PO TABS
600.0000 mg | ORAL_TABLET | Freq: Once | ORAL | Status: AC
  Administered 2022-03-04: 600 mg via ORAL
  Filled 2022-03-04: qty 1

## 2022-03-04 MED ORDER — ALBUTEROL SULFATE (2.5 MG/3ML) 0.083% IN NEBU: 2.5000 mg | INHALATION_SOLUTION | Freq: Four times a day (QID) | RESPIRATORY_TRACT | 12 refills | Status: DC | PRN

## 2022-03-04 NOTE — ED Notes (Signed)
Discharge paperwork reviewed entirely with patient, including Rx's and follow up care. Pain was under control. Pt verbalized understanding as well as all parties involved. No questions or concerns voiced at the time of discharge. No acute distress noted.   Pt ambulated out to PVA without incident or assistance.  

## 2022-03-04 NOTE — ED Provider Notes (Signed)
[facility] Provider Note   CSN: [id number] Arrival date & time: 03/04/22  1000     History  Chief Complaint  Patient presents with   Nausea   HPI Michele Jennings is a 27 y.o. female with asthma, GERD and OSA presenting for nausea and headache.  Started 2 days ago.  Denies fever and sick contacts.  Denies vomiting diarrhea.  Pain.  States she has been mildly short of breath.  Used her albuterol inhaler yesterday which helped considerably.  Also endorsing congestion but no cough.  Taking Robitussin for congestion.  Also endorsing some lower abdominal pain and increased urinary frequency.  No bowel changes.  No abnormal vaginal discharge or bleeding.  HPI     Home Medications Prior to Admission medications   Medication Sig Start Date End Date Taking? Authorizing Provider  albuterol (PROVENTIL) (2.5 MG/3ML) 0.083% nebulizer solution Take 3 mLs (2.5 mg total) by nebulization every 6 (six) hours as needed for wheezing or shortness of breath. 03/04/22  Yes Harriet Pho, PA-C  nitrofurantoin, macrocrystal-monohydrate, (MACROBID) 100 MG capsule Take 1 capsule (100 mg total) by mouth 2 (two) times daily. 03/04/22  Yes Harriet Pho, PA-C  oseltamivir (TAMIFLU) 75 MG capsule Take 1 capsule (75 mg total) by mouth every 12 (twelve) hours. 03/04/22  Yes Harriet Pho, PA-C  Multiple Vitamin (MULTIVITAMIN) tablet Take 1 tablet by mouth daily.    [provider]  naproxen (NAPROSYN) 500 MG tablet Take 1 tablet (500 mg total) by mouth 2 (two) times daily with a meal. 10/17/21   Palumbo, April, MD  ondansetron (ZOFRAN) 4 MG tablet Take 1 tablet (4 mg total) by mouth every 8 (eight) hours as needed for nausea or vomiting. 02/15/22   Quintella Reichert, MD  ondansetron (ZOFRAN-ODT) 4 MG disintegrating tablet 4mg  ODT q4 hours prn nausea/vomit 12/25/21   Drenda Freeze, MD  predniSONE (DELTASONE) 20 MG tablet Take 2 tablets (40 mg total) by mouth  daily. 11/04/21   Davonna Belling, MD  sucralfate (CARAFATE) 1 GM/10ML suspension Take 10 mLs (1 g total) by mouth 4 (four) times daily -  with meals and at bedtime. 10/17/21   Palumbo, April, MD  SYMBICORT 80-4.5 MCG/ACT inhaler Inhale 2 puffs into the lungs 2 (two) times daily. 11/05/21   [provider]      Allergies    Shellfish allergy, Milk protein, Other, and Tilactase    Review of Systems   Review of Systems  HENT:  Positive for congestion.   Gastrointestinal:  Positive for abdominal pain and nausea.  Genitourinary:  Positive for frequency.    Physical Exam   Vitals:   03/04/22 1017  BP: 124/88  Pulse: 92  Resp: 18  Temp: 98.5 F (36.9 C)  SpO2: 99%    CONSTITUTIONAL:  well-appearing, NAD NEURO:  Alert and oriented x 3, CN 3-12 grossly intact EYES:  eyes equal and reactive ENT/NECK:  Supple, no stridor  CARDIO:  regular rate and rhythm, appears well-perfused  PULM:  No respiratory distress, CTAB GI/GU:  non-distended, soft, suprapubic tenderness MSK/SPINE:  No gross deformities, no edema, moves all extremities  SKIN:  no rash, atraumatic   *Additional and/or pertinent findings included in MDM below    ED Results / Procedures / Treatments   Labs (all labs ordered are listed, but only abnormal results are displayed) Labs Reviewed  RESP PANEL BY RT-PCR (RSV, FLU A&B, COVID)  RVPGX2 - Abnormal; Notable for the  following components:      Result Value   Influenza B by PCR POSITIVE (*)    All other components within normal limits  URINALYSIS, ROUTINE W REFLEX MICROSCOPIC - Abnormal; Notable for the following components:   Hgb urine dipstick TRACE (*)    All other components within normal limits  URINALYSIS, MICROSCOPIC (REFLEX) - Abnormal; Notable for the following components:   Bacteria, UA MANY (*)    All other components within normal limits  PREGNANCY, URINE    EKG None  Radiology No results found.  Procedures Procedures    Medications  Ordered in ED Medications  nitrofurantoin (macrocrystal-monohydrate) (MACROBID) capsule 100 mg (has no administration in time range)  ibuprofen (ADVIL) tablet 600 mg (has no administration in time range)    ED Course/ Medical Decision Making/ A&P                             Medical Decision Making Amount and/or Complexity of Data Reviewed Labs: ordered.   27 year old female who is well-appearing hemodynamically stable presenting for nausea, headache and lower abdominal pain.  Physical exam was notable for suprapubic tenderness.  Differential diagnosis for this complaint includes flu, COVID, RSV, acute cystitis and asthma exacerbation.  I personally reviewed and interpreted her labs which revealed bacteriuria and hematuria.  Respiratory panel also revealed that she is positive for the flu.  Sent Tamiflu to her pharmacy.  Treated her symptoms and headache with ibuprofen.  Given suprapubic tenderness and findings on UA, concern for acute cystitis.  Treated with nitrofurantoin.  Sent nitrofurantoin to her pharmacy.  Considered asthma exacerbation but unlikely given no wheezing on exam with stable vitals.  No evidence of respiratory distress.  Discussed return precautions.  Refilled her nebulized albuterol for home use.        Final Clinical Impression(s) / ED Diagnoses Final diagnoses:  Acute cystitis with hematuria  Influenza    Rx / DC Orders ED Discharge Orders          Ordered    oseltamivir (TAMIFLU) 75 MG capsule  Every 12 hours        03/04/22 1332    albuterol (PROVENTIL) (2.5 MG/3ML) 0.083% nebulizer solution  Every 6 hours PRN        03/04/22 1334    nitrofurantoin, macrocrystal-monohydrate, (MACROBID) 100 MG capsule  2 times daily        03/04/22 1335              Harriet Pho, PA-C 03/04/22 1413    Horton, Alvin Critchley, DO 03/04/22 1551

## 2022-03-04 NOTE — ED Triage Notes (Signed)
C/o headache, nausea, cough since yesterday. Some lower abdominal pain, denies urinary symptoms.

## 2022-03-04 NOTE — ED Notes (Signed)
States took a pregnancy test yesterday, was negative. States does not want pregnancy test.

## 2022-03-04 NOTE — Discharge Instructions (Addendum)
Evaluation today revealed that you do have the flu and a UTI.  I have sent Tamiflu to to your pharmacy along with nitrofurantoin which is an antibiotic that will cover for UTI.  I have also sent refills for your albuterol solution for your nebulizer.

## 2022-05-05 ENCOUNTER — Emergency Department (HOSPITAL_BASED_OUTPATIENT_CLINIC_OR_DEPARTMENT_OTHER)
Admission: EM | Admit: 2022-05-05 | Discharge: 2022-05-05 | Disposition: A | Discharge status: Home / Self Care | Payer: BC Managed Care – PPO | Attending: Emergency Medicine | Admitting: Emergency Medicine

## 2022-05-05 ENCOUNTER — Other Ambulatory Visit: Payer: Self-pay

## 2022-05-05 ENCOUNTER — Emergency Department (HOSPITAL_BASED_OUTPATIENT_CLINIC_OR_DEPARTMENT_OTHER): Payer: BC Managed Care – PPO

## 2022-05-05 DIAGNOSIS — Z7951 Long term (current) use of inhaled steroids: Secondary | ICD-10-CM | POA: Insufficient documentation

## 2022-05-05 DIAGNOSIS — R202 Paresthesia of skin: Secondary | ICD-10-CM | POA: Insufficient documentation

## 2022-05-05 DIAGNOSIS — J45909 Unspecified asthma, uncomplicated: Secondary | ICD-10-CM | POA: Diagnosis not present

## 2022-05-05 DIAGNOSIS — M79605 Pain in left leg: Secondary | ICD-10-CM | POA: Insufficient documentation

## 2022-05-05 NOTE — ED Notes (Signed)
Discharge paperwork reviewed entirely with patient, including Rx's and follow up care. Pain was under control. Pt verbalized understanding as well as all parties involved. No questions or concerns voiced at the time of discharge. No acute distress noted.   Pt ambulated out to PVA without incident or assistance.  

## 2022-05-05 NOTE — Discharge Instructions (Addendum)
You are seen in the ER today for evaluation of your diffuse left leg pain.  I likely think this is sciatic pain or some muscular strain given you recently moving.  Your ultrasound was negative for a DVT.  I would like you to follow-up with a sports medicine provider Dr. Clearance Coots.  The information is included the discharge paperwork.  I would like you to take 600 mg of ibuprofen every 6 hours at least for the next 24 to 48 hours.  Do not take this if you are concerned for any pregnancy.  You can also add 1000 mg of Tylenol every 6 hours to this as well.  I recommend doing lidocaine patches to your lower back as well.  If you have any concerns or any worsening symptoms, please return to the nearest emergency department for evaluation.  Contact a doctor if: Your pain is not controlled by medicine. Your pain does not get better. Your pain gets worse. Your pain lasts longer than 4 weeks. You lose weight without trying. Get help right away if: You cannot control when you pee (urinate) or poop (have a bowel movement). You have weakness in any of these areas and it gets worse: Lower back. The area between your hip bones. Butt. Legs. You have redness or swelling of your back. You have a burning feeling when you pee.

## 2022-05-05 NOTE — ED Triage Notes (Signed)
Patient presents to ED via POV from home. Here with left leg pain that began today. Denies recent injury or trauma.

## 2022-05-05 NOTE — ED Notes (Signed)
Alert, NAD, calm, interactive. Pending [place]. [place] contacted, pt on the list.

## 2022-05-06 NOTE — ED Provider Notes (Signed)
[facility] Provider Note   CSN: [id number] Arrival date & time: 05/05/22  1819     History Chief Complaint  Patient presents with   Leg Pain    Michele Jennings is a 27 y.o. female with h/o asthma presents to the ER for evaluation of LLE pain from calf up to lateral hip that started this morning.  Reports some tingling but denies any numbness.  Denies any saddle anesthesia, urinary fecal incontinence, urinary retention, fevers, IV drug use, or history of cancer.  She denies any exacerbating relieving factors.  Has not taken any pain medication prior to arrival.  Denies any new activities.  No fever or flulike symptoms.  No chest pain or shortness of breath.  She reports that she has a family history of DVTs and is concerned.  She does not use any exertional hormones, no longer travel or prolonged sitting, no hemoptysis, no unilateral leg swelling.  She reports that she had surgery on the left leg as a child but does not know what for.  Denies any daily medications.  No known drug allergies.  Denies any tobacco, EtOH, or drug use.   Leg Pain Associated symptoms: no back pain and no fever        Home Medications Prior to Admission medications   Medication Sig Start Date End Date Taking? Authorizing Provider  albuterol (PROVENTIL) (2.5 MG/3ML) 0.083% nebulizer solution Take 3 mLs (2.5 mg total) by nebulization every 6 (six) hours as needed for wheezing or shortness of breath. 03/04/22   Harriet Pho, PA-C  Multiple Vitamin (MULTIVITAMIN) tablet Take 1 tablet by mouth daily.    [provider]  naproxen (NAPROSYN) 500 MG tablet Take 1 tablet (500 mg total) by mouth 2 (two) times daily with a meal. 10/17/21   Palumbo, April, MD  nitrofurantoin, macrocrystal-monohydrate, (MACROBID) 100 MG capsule Take 1 capsule (100 mg total) by mouth 2 (two) times daily. 03/04/22   Harriet Pho, PA-C  ondansetron (ZOFRAN) 4 MG tablet Take 1 tablet (4  mg total) by mouth every 8 (eight) hours as needed for nausea or vomiting. 02/15/22   Quintella Reichert, MD  ondansetron (ZOFRAN-ODT) 4 MG disintegrating tablet 4mg  ODT q4 hours prn nausea/vomit 12/25/21   Drenda Freeze, MD  oseltamivir (TAMIFLU) 75 MG capsule Take 1 capsule (75 mg total) by mouth every 12 (twelve) hours. 03/04/22   Harriet Pho, PA-C  predniSONE (DELTASONE) 20 MG tablet Take 2 tablets (40 mg total) by mouth daily. 11/04/21   Davonna Belling, MD  sucralfate (CARAFATE) 1 GM/10ML suspension Take 10 mLs (1 g total) by mouth 4 (four) times daily -  with meals and at bedtime. 10/17/21   Palumbo, April, MD  SYMBICORT 80-4.5 MCG/ACT inhaler Inhale 2 puffs into the lungs 2 (two) times daily. 11/05/21   [provider]      Allergies    Shellfish allergy, Milk protein, Other, and Tilactase    Review of Systems   Review of Systems  Constitutional:  Negative for chills and fever.  Respiratory:  Negative for shortness of breath.   Cardiovascular:  Negative for chest pain.  Musculoskeletal:  Positive for myalgias. Negative for back pain.    Physical Exam Updated Vital Signs BP 120/69 (BP Location: Left Arm)   Pulse 81   Temp 98.1 F (36.7 C) (Oral)   Resp 16   Ht 5\' 1"  (1.549 m)   Wt 76.2 kg   SpO2 98%  BMI 31.74 kg/m  Physical Exam Vitals and nursing note reviewed.  Constitutional:      General: She is not in acute distress.    Appearance: Normal appearance. She is not ill-appearing or toxic-appearing.  Eyes:     General: No scleral icterus. Pulmonary:     Effort: Pulmonary effort is normal. No respiratory distress.  Musculoskeletal:        General: No swelling or tenderness.     Right lower leg: No edema.     Left lower leg: No edema.     Comments: No swelling noted to the left lower extremity.  Compartments are soft.  Coloration temperature appear and feel symmetric.  Palpable DP and PT pulses bilaterally.  She is ambulatory.  Full flexion extension of  the ankle knee and hip.  Sensation is intact and symmetric.  She does not have any midline or paraspinal cervical, thoracic, or lumbar back pain to palpation.  Some tenderness more to the lateral aspect of her left hip.  Skin:    General: Skin is dry.     Findings: No rash.  Neurological:     General: No focal deficit present.     Mental Status: She is alert. Mental status is at baseline.  Psychiatric:        Mood and Affect: Mood normal.     ED Results / Procedures / Treatments   Labs (all labs ordered are listed, but only abnormal results are displayed) Labs Reviewed - No data to display  EKG None  Radiology US Venous Img Lower Unilateral Left  Result Date: 05/05/2022 CLINICAL DATA:  Left calf pain for 1 day radiating into thigh. EXAM: LEFT LOWER EXTREMITY VENOUS DOPPLER ULTRASOUND TECHNIQUE: Gray-scale sonography with compression, as well as color and duplex ultrasound, were performed to evaluate the deep venous system(s) from the level of the common femoral vein through the popliteal and proximal calf veins. COMPARISON:  None Available. FINDINGS: VENOUS Normal compressibility of the common femoral, superficial femoral, and popliteal veins, as well as the visualized calf veins. Visualized portions of profunda femoral vein and great saphenous vein unremarkable. No filling defects to suggest DVT on grayscale or color Doppler imaging. Doppler waveforms show normal direction of venous flow, normal respiratory plasticity and response to augmentation. Limited views of the contralateral common femoral vein are unremarkable. OTHER None. Limitations: none IMPRESSION: Negative. Electronically Signed   By: Brett Fairy M.D.   On: 05/05/2022 22:39    Procedures Procedures   Medications Ordered in ED Medications - No data to display  ED Course/ Medical Decision Making/ A&P   Medical Decision Making  27 y.o. female presents to the ER today for evaluation of left leg pain. Differential  diagnosis includes but is not limited to muscular strain/sprain, radiculopathy, DVT. Vital signs unremarkable. Physical exam as noted above.   DVT study is negative.  I discussed the ultrasound findings with the patient at bedside.  I asked her again reassured that there is not any new activity or heavy lifting that she is been doing she reports that she did move into a new house 4 days ago and is been going up and down the stairs a lot moving boxes.  She denied this to me initially but reports that she was concerned for a blood clot and wanted an [place] to make sure that this wasn't a blood clot. Apparently, she has already had an [place] of this leg previously for pain similar after she started exercising and is supposed  to be seeing PT.   Overall, given the update story and negative imaging, this is likely muscular pain.  Strength sensation is equal bilaterally.  She has no focal deficit.  Compartments are soft.  Will recommend lidocaine patches and Tylenol/ibuprofen.  Sports medicine provider included in the discharge report for her to follow-up with.  We discussed plan at bedside. We discussed strict return precautions and red flag symptoms. The patient verbalized their understanding and agrees to the plan. The patient is stable and being discharged home in good condition.  Portions of this report may have been transcribed using voice recognition software. Every effort was made to ensure accuracy; however, inadvertent computerized transcription errors may be present.   Final Clinical Impression(s) / ED Diagnoses Final diagnoses:  Left leg pain    Rx / DC Orders ED Discharge Orders     None         Sherrell Puller, PA-C 05/06/22 1456    Regan Lemming, MD 05/06/22 1521

## 2023-01-04 ENCOUNTER — Encounter (HOSPITAL_COMMUNITY): Payer: Self-pay

## 2023-01-04 ENCOUNTER — Emergency Department (HOSPITAL_COMMUNITY)
Admission: EM | Admit: 2023-01-04 | Discharge: 2023-01-04 | Disposition: A | Discharge status: Home / Self Care | Payer: BC Managed Care – PPO | Attending: Emergency Medicine | Admitting: Emergency Medicine

## 2023-01-04 ENCOUNTER — Other Ambulatory Visit: Payer: Self-pay

## 2023-01-04 ENCOUNTER — Emergency Department (HOSPITAL_COMMUNITY): Payer: BC Managed Care – PPO

## 2023-01-04 DIAGNOSIS — J45901 Unspecified asthma with (acute) exacerbation: Secondary | ICD-10-CM | POA: Diagnosis not present

## 2023-01-04 DIAGNOSIS — Z1152 Encounter for screening for COVID-19: Secondary | ICD-10-CM | POA: Insufficient documentation

## 2023-01-04 DIAGNOSIS — Z7951 Long term (current) use of inhaled steroids: Secondary | ICD-10-CM | POA: Insufficient documentation

## 2023-01-04 DIAGNOSIS — R0602 Shortness of breath: Secondary | ICD-10-CM | POA: Diagnosis present

## 2023-01-04 LAB — SARS CORONAVIRUS 2 BY RT PCR: SARS Coronavirus 2 by RT PCR: NEGATIVE

## 2023-01-04 MED ORDER — ALBUTEROL SULFATE (2.5 MG/3ML) 0.083% IN NEBU: 2.5000 mg | INHALATION_SOLUTION | Freq: Four times a day (QID) | RESPIRATORY_TRACT | 2 refills | Status: AC | PRN

## 2023-01-04 MED ORDER — DEXAMETHASONE 4 MG PO TABS
10.0000 mg | ORAL_TABLET | Freq: Once | ORAL | Status: AC
  Administered 2023-01-04: 10 mg via ORAL
  Filled 2023-01-04: qty 1

## 2023-01-04 MED ORDER — IPRATROPIUM-ALBUTEROL 0.5-2.5 (3) MG/3ML IN SOLN
3.0000 mL | Freq: Once | RESPIRATORY_TRACT | Status: AC
  Administered 2023-01-04: 3 mL via RESPIRATORY_TRACT
  Filled 2023-01-04: qty 3

## 2023-01-04 MED ORDER — LORATADINE 10 MG PO TABS: 10.0000 mg | ORAL_TABLET | Freq: Every day | ORAL | 1 refills | Status: AC

## 2023-01-04 MED ORDER — BUDESONIDE-FORMOTEROL FUMARATE 80-4.5 MCG/ACT IN AERO: 2.0000 | INHALATION_SPRAY | Freq: Two times a day (BID) | RESPIRATORY_TRACT | 12 refills | Status: AC

## 2023-01-04 NOTE — ED Notes (Addendum)
Pt stood up and ambulated in room with pulse ox attached. SpO2 stayed between 99-100% while pt ambulated around room. Pt reports increased SHOB while ambulating.

## 2023-01-04 NOTE — ED Provider Notes (Signed)
[place] EMERGENCY DEPARTMENT AT [facility] Provider Note   CSN: [id number] Arrival date & time: 01/04/23  5784     History  Chief Complaint  Patient presents with   Shortness of Breath    Michele Jennings is a 27 y.o. female.  27 year old female with a history of asthma presents to the emergency department for evaluation of shortness of breath.  She initially thought that her symptoms were related to allergies.  Has been using her home albuterol nebulizer without significant improvement; last used just PTA.  Does take daily Singulair tablets.  Is supposed to be on Symbicort, but she has not taken this in quite some time due to financial burden.  No associated fevers, hemoptysis, syncope, chest pain.  Denies sick contacts.  The history is provided by the patient. No language interpreter was used.  Shortness of Breath      Home Medications Prior to Admission medications   Medication Sig Start Date End Date Taking? Authorizing Provider  albuterol (VENTOLIN HFA) 108 (90 Base) MCG/ACT inhaler Inhale 2 puffs into the lungs every 6 (six) hours as needed for wheezing or shortness of breath.   Yes [provider]  budesonide-formoterol (SYMBICORT) 80-4.5 MCG/ACT inhaler Inhale 2 puffs into the lungs 2 (two) times daily. 01/04/23  Yes Antony Madura, PA-C  fluticasone (FLONASE) 50 MCG/ACT nasal spray Place 1 spray into both nostrils daily as needed for allergies. 05/12/22  Yes [provider]  loratadine (CLARITIN) 10 MG tablet Take 1 tablet (10 mg total) by mouth daily. 01/04/23  Yes Antony Madura, PA-C  montelukast (SINGULAIR) 10 MG tablet Take by mouth. 05/12/22  Yes [provider]  Multiple Vitamin (MULTIVITAMIN) tablet Take 1 tablet by mouth daily.   Yes [provider]  norethindrone-ethinyl estradiol-FE (LOESTRIN FE) 1-20 MG-MCG tablet Take 1 tablet by mouth daily. 01/01/23  Yes [provider]  omeprazole (PRILOSEC) 40 MG capsule  Take 1 capsule by mouth daily as needed. 09/21/22  Yes [provider]  albuterol (PROVENTIL) (2.5 MG/3ML) 0.083% nebulizer solution Take 3 mLs (2.5 mg total) by nebulization every 6 (six) hours as needed for wheezing or shortness of breath. 01/04/23   Antony Madura, PA-C  naproxen (NAPROSYN) 500 MG tablet Take 1 tablet (500 mg total) by mouth 2 (two) times daily with a meal. Patient not taking: Reported on 01/04/2023 10/17/21   Palumbo, April, MD  nitrofurantoin, macrocrystal-monohydrate, (MACROBID) 100 MG capsule Take 1 capsule (100 mg total) by mouth 2 (two) times daily. Patient not taking: Reported on 01/04/2023 03/04/22   Gareth Eagle, PA-C  ondansetron (ZOFRAN) 4 MG tablet Take 1 tablet (4 mg total) by mouth every 8 (eight) hours as needed for nausea or vomiting. Patient not taking: Reported on 01/04/2023 02/15/22   Tilden Fossa, MD  ondansetron (ZOFRAN-ODT) 4 MG disintegrating tablet 4mg  ODT q4 hours prn nausea/vomit Patient not taking: Reported on 01/04/2023 12/25/21   Charlynne Pander, MD  oseltamivir (TAMIFLU) 75 MG capsule Take 1 capsule (75 mg total) by mouth every 12 (twelve) hours. Patient not taking: Reported on 01/04/2023 03/04/22   Gareth Eagle, PA-C  predniSONE (DELTASONE) 20 MG tablet Take 2 tablets (40 mg total) by mouth daily. Patient not taking: Reported on 01/04/2023 11/04/21   Benjiman Core, MD  sucralfate (CARAFATE) 1 GM/10ML suspension Take 10 mLs (1 g total) by mouth 4 (four) times daily -  with meals and at bedtime. Patient not taking: Reported on 01/04/2023 10/17/21   Palumbo, April,  MD      Allergies    Shellfish allergy, Milk protein, Other, and Tilactase    Review of Systems   Review of Systems  Respiratory:  Positive for shortness of breath.   Ten systems reviewed and are negative for acute change, except as noted in the HPI.    Physical Exam Updated Vital Signs BP 121/78   Pulse (!) 101   Temp 98.3 F (36.8 C) (Oral)   Resp 18   Ht 5'  1" (1.549 m)   Wt 81.6 kg   LMP 01/04/2023   SpO2 99%   BMI 34.01 kg/m   Physical Exam Vitals and nursing note reviewed.  Constitutional:      General: She is not in acute distress.    Appearance: She is well-developed. She is not diaphoretic.     Comments: Nontoxic-appearing and in no acute distress  HENT:     Head: Normocephalic and atraumatic.  Eyes:     General: No scleral icterus.    Extraocular Movements: EOM normal.     Conjunctiva/sclera: Conjunctivae normal.  Cardiovascular:     Rate and Rhythm: Normal rate and regular rhythm.     Pulses: Normal pulses.  Pulmonary:     Effort: Pulmonary effort is normal. No respiratory distress.     Comments: Minimal wheezing scattered bilaterally, but moving air well.  No tachypnea or dyspnea.  Chest expansion symmetric. Musculoskeletal:        General: Normal range of motion.     Cervical back: Normal range of motion.  Skin:    General: Skin is warm and dry.     Coloration: Skin is not pale.     Findings: No erythema or rash.  Neurological:     Mental Status: She is alert and oriented to person, place, and time.     Coordination: Coordination normal.  Psychiatric:        Mood and Affect: Mood and affect normal.        Behavior: Behavior normal.     ED Results / Procedures / Treatments   Labs (all labs ordered are listed, but only abnormal results are displayed) Labs Reviewed  SARS CORONAVIRUS 2 BY RT PCR    EKG EKG Interpretation Date/Time:  Monday January 04 2023 03:31:11 EST Ventricular Rate:  89 PR Interval:  150 QRS Duration:  94 QT Interval:  370 QTC Calculation: 451 R Axis:   81  Text Interpretation: Sinus rhythm Borderline Q waves in lateral leads Confirmed by Tilden Fossa [phone]) on 01/04/2023 3:44:13 AM  Radiology DG Chest Portable 1 View  Result Date: 01/04/2023 CLINICAL DATA:  27 year old female with shortness of breath. Asthma. EXAM: PORTABLE CHEST 1 VIEW COMPARISON:  Chest radiographs  12/25/2021 and earlier. FINDINGS: Portable AP semi upright view at 0416 hours. Lung volumes are at the upper limits of normal to mildly hyperinflated. Normal cardiac size and mediastinal contours. Visualized tracheal air column is within normal limits. Both lungs are clear. No pneumothorax or pleural effusion. Paucity of bowel gas. No osseous abnormality identified. IMPRESSION: Borderline to mild hyperinflation, otherwise negative portable chest. Electronically Signed   By: Odessa Fleming M.D.   On: 01/04/2023 04:43     Procedures Procedures    Medications Ordered in ED Medications  dexamethasone (DECADRON) tablet 10 mg (10 mg Oral Given 01/04/23 0412)  ipratropium-albuterol (DUONEB) 0.5-2.5 (3) MG/3ML nebulizer solution 3 mL (3 mLs Nebulization Given 01/04/23 0522)    ED Course/ Medical Decision Making/ A&P Clinical Course as of  01/17/23 0251  Mon Jan 04, 2023  0506 Patient ambulatory with O2 sats staying between 99-100%. Patient does report some SOB. Will give Duoneb. CXR negative for acute infiltrate, PTX, pleural effusion. [KH]  [phone] Patient feeling better after the nebulizer treatment.  Lungs are clear on repeat auscultation.  Requesting COVID test which has been ordered, though do not feel that patient needs to remain in the department for results.  Advised follow-up on COVID results through MyChart. [KH]    Clinical Course User Index [KH] Antony Madura, PA-C                                 Medical Decision Making Amount and/or Complexity of Data Reviewed Radiology: ordered.  Risk OTC drugs. Prescription drug management.   This patient presents to the ED for concern of SOB, this involves an extensive number of treatment options, and is a complaint that carries with it a high risk of complications and morbidity.  The differential diagnosis includes asthma exacerbation vs viral illness vs PNA vs PTX vs pleural effusion   Co morbidities that complicate the patient  evaluation  Asthma   Additional history obtained:  External records from outside source obtained and reviewed including PCP visit on 01/01/23   Lab Tests:  I Ordered, and personally interpreted labs.  The pertinent results include:  negative COVID   Imaging Studies ordered:  I ordered imaging studies including CXR  I independently visualized and interpreted imaging which showed borderline hyperinflation. No PTX or PNA I agree with the radiologist interpretation   Cardiac Monitoring:  The patient was maintained on a cardiac monitor.  I personally viewed and interpreted the cardiac monitored which showed an underlying rhythm of: NSR   Medicines ordered and prescription drug management:  I ordered medication including Decadron and Duoneb for SOB, asthma  Reevaluation of the patient after these medicines showed that the patient improved I have reviewed the patients home medicines and have made adjustments as needed   Test Considered:  CBC   Problem List / ED Course:  As above   Reevaluation:  After the interventions noted above, I reevaluated the patient and found that they have :improved   Dispostion:  After consideration of the diagnostic results and the patients response to treatment, I feel that the patent would benefit from resuming prior Rx of Symbicort; Rx given. Will also continue on daily claritin, PRN albuterol. Given Decadron prior to discharge. Return precautions discussed and provided. Patient discharged in stable condition with no unaddressed concerns.          Final Clinical Impression(s) / ED Diagnoses Final diagnoses:  Exacerbation of asthma, unspecified asthma severity, unspecified whether persistent    Rx / DC Orders ED Discharge Orders          Ordered    budesonide-formoterol (SYMBICORT) 80-4.5 MCG/ACT inhaler  2 times daily        01/04/23 0547    albuterol (PROVENTIL) (2.5 MG/3ML) 0.083% nebulizer solution  Every 6 hours PRN         01/04/23 0547    loratadine (CLARITIN) 10 MG tablet  Daily        01/04/23 0547              Antony Madura, PA-C 01/17/23 0306    Tilden Fossa, MD 01/22/23 [phone]

## 2023-01-04 NOTE — Discharge Instructions (Signed)
Resume use of your daily Symbicort.  Continue with albuterol every 4-6 hours as needed for persistent cough, wheezing, shortness of breath.  Use Claritin as prescribed to help manage nasal congestion.  Follow-up with your primary care doctor to ensure resolution of symptoms.

## 2023-01-04 NOTE — ED Triage Notes (Signed)
Pt arrived POV d/t SOB since last night.   Pt has hx of asthma and used her Nebulizer x2 at home but did not help.

## 2023-04-22 ENCOUNTER — Emergency Department (HOSPITAL_COMMUNITY)
Admission: EM | Admit: 2023-04-22 | Discharge: 2023-04-22 | Disposition: A | Discharge status: Home / Self Care | Attending: Emergency Medicine | Admitting: Emergency Medicine

## 2023-04-22 ENCOUNTER — Other Ambulatory Visit: Payer: Self-pay

## 2023-04-22 ENCOUNTER — Emergency Department (HOSPITAL_BASED_OUTPATIENT_CLINIC_OR_DEPARTMENT_OTHER)

## 2023-04-22 ENCOUNTER — Encounter (HOSPITAL_COMMUNITY): Payer: Self-pay

## 2023-04-22 DIAGNOSIS — M79662 Pain in left lower leg: Secondary | ICD-10-CM

## 2023-04-22 DIAGNOSIS — M79605 Pain in left leg: Secondary | ICD-10-CM | POA: Diagnosis present

## 2023-04-22 DIAGNOSIS — J45909 Unspecified asthma, uncomplicated: Secondary | ICD-10-CM | POA: Diagnosis not present

## 2023-04-22 MED ORDER — METHOCARBAMOL 500 MG PO TABS: 500.0000 mg | ORAL_TABLET | Freq: Two times a day (BID) | ORAL | 0 refills | Status: AC

## 2023-04-22 MED ORDER — KETOROLAC TROMETHAMINE 15 MG/ML IJ SOLN
15.0000 mg | Freq: Once | INTRAMUSCULAR | Status: AC
  Administered 2023-04-22: 15 mg via INTRAMUSCULAR
  Filled 2023-04-22 (×2): qty 1

## 2023-04-22 NOTE — ED Notes (Signed)
 Pt refused pain medication at this time and requested to wait until after US done.

## 2023-04-22 NOTE — ED Triage Notes (Signed)
 Pt reports pain in left leg intermittently for past couple of days. Pt ambulating independently with no difficulty. No swelling noted. Pt denies any redness/discoloration. Pain improves with movement and massage per patient.

## 2023-04-22 NOTE — Discharge Instructions (Addendum)

## 2023-04-22 NOTE — ED Triage Notes (Signed)
 No excessive exercise or injury per patient.

## 2023-04-22 NOTE — ED Provider Notes (Signed)
 [place] EMERGENCY DEPARTMENT AT [facility] Provider Note   CSN: [id number] Arrival date & time: 04/22/23  0631     History  Chief Complaint  Patient presents with   Leg Pain    Michele Jennings is a 28 y.o. female with past medical history significant for GERD, asthma, eczema, anxiety who presents concern for left leg pain intermittently for the last couple of days.  She describes a burning sensation that starts in her anterior tibia and goes into her anterior left thigh.  She denies any injury, she denies excessive time walking or standing.  She does report that she had remote surgery on a bacterial infection on the leg but that she is not currently having any skin changes or signs of infection.  Reports she has had problems with this leg intermittently for a few years.  Tried ibuprofen at home without significant relief.  HPI     Home Medications Prior to Admission medications   Medication Sig Start Date End Date Taking? Authorizing Provider  methocarbamol (ROBAXIN) 500 MG tablet Take 1 tablet (500 mg total) by mouth 2 (two) times daily. 04/22/23  Yes Adin Laker H, PA-C  albuterol (PROVENTIL) (2.5 MG/3ML) 0.083% nebulizer solution Take 3 mLs (2.5 mg total) by nebulization every 6 (six) hours as needed for wheezing or shortness of breath. 01/04/23   Antony Madura, PA-C  albuterol (VENTOLIN HFA) 108 (90 Base) MCG/ACT inhaler Inhale 2 puffs into the lungs every 6 (six) hours as needed for wheezing or shortness of breath.    [provider]  budesonide-formoterol (SYMBICORT) 80-4.5 MCG/ACT inhaler Inhale 2 puffs into the lungs 2 (two) times daily. 01/04/23   Antony Madura, PA-C  fluticasone (FLONASE) 50 MCG/ACT nasal spray Place 1 spray into both nostrils daily as needed for allergies. 05/12/22   [provider]  loratadine (CLARITIN) 10 MG tablet Take 1 tablet (10 mg total) by mouth daily. 01/04/23   Antony Madura, PA-C  montelukast (SINGULAIR) 10 MG  tablet Take by mouth. 05/12/22   [provider]  Multiple Vitamin (MULTIVITAMIN) tablet Take 1 tablet by mouth daily.    [provider]  naproxen (NAPROSYN) 500 MG tablet Take 1 tablet (500 mg total) by mouth 2 (two) times daily with a meal. Patient not taking: Reported on 01/04/2023 10/17/21   Palumbo, April, MD  nitrofurantoin, macrocrystal-monohydrate, (MACROBID) 100 MG capsule Take 1 capsule (100 mg total) by mouth 2 (two) times daily. Patient not taking: Reported on 01/04/2023 03/04/22   Gareth Eagle, PA-C  norethindrone-ethinyl estradiol-FE (LOESTRIN FE) 1-20 MG-MCG tablet Take 1 tablet by mouth daily. 01/01/23   [provider]  omeprazole (PRILOSEC) 40 MG capsule Take 1 capsule by mouth daily as needed. 09/21/22   [provider]  ondansetron (ZOFRAN) 4 MG tablet Take 1 tablet (4 mg total) by mouth every 8 (eight) hours as needed for nausea or vomiting. Patient not taking: Reported on 01/04/2023 02/15/22   Tilden Fossa, MD  ondansetron (ZOFRAN-ODT) 4 MG disintegrating tablet 4mg  ODT q4 hours prn nausea/vomit Patient not taking: Reported on 01/04/2023 12/25/21   Charlynne Pander, MD  oseltamivir (TAMIFLU) 75 MG capsule Take 1 capsule (75 mg total) by mouth every 12 (twelve) hours. Patient not taking: Reported on 01/04/2023 03/04/22   Gareth Eagle, PA-C  predniSONE (DELTASONE) 20 MG tablet Take 2 tablets (40 mg total) by mouth daily. Patient not taking: Reported on 01/04/2023 11/04/21   Benjiman Core, MD  sucralfate (CARAFATE) 1 GM/10ML  suspension Take 10 mLs (1 g total) by mouth 4 (four) times daily -  with meals and at bedtime. Patient not taking: Reported on 01/04/2023 10/17/21   Nicanor Alcon, April, MD      Allergies    Shellfish allergy, Milk protein, Other, and Tilactase    Review of Systems   Review of Systems  All other systems reviewed and are negative.   Physical Exam Updated Vital Signs BP 110/79   Pulse 80   Temp 98.1 F (36.7  C) (Oral)   Resp 18   Ht 5\' 1"  (1.549 m)   Wt 80.7 kg   SpO2 98%   BMI 33.63 kg/m  Physical Exam Vitals and nursing note reviewed.  Constitutional:      General: She is not in acute distress.    Appearance: Normal appearance.  HENT:     Head: Normocephalic and atraumatic.  Eyes:     General:        Right eye: No discharge.        Left eye: No discharge.  Cardiovascular:     Rate and Rhythm: Normal rate and regular rhythm.  Pulmonary:     Effort: Pulmonary effort is normal. No respiratory distress.  Musculoskeletal:        General: No deformity.     Comments: Some mild tenderness to palpation of the anterior left tibia, left lower quadricep, no significant tenderness or swelling at the left knee.  No overlying skin changes of the left leg.  Normal range of motion to plantarflexion, dorsiflexion of the affected left ankle, flexion, extension of the left knee.  Intact strength 5/5 to flexion, extension at the left hip, left knee, and left ankle. No calf swelling, redness.  Skin:    General: Skin is warm and dry.  Neurological:     Mental Status: She is alert and oriented to person, place, and time.  Psychiatric:        Mood and Affect: Mood normal.        Behavior: Behavior normal.     ED Results / Procedures / Treatments   Labs (all labs ordered are listed, but only abnormal results are displayed) Labs Reviewed - No data to display  EKG None  Radiology VAS [place] LOWER EXTREMITY VENOUS (DVT) (7a-7p) Result Date: 04/22/2023  Lower Venous DVT Study Patient Name:  Michele Jennings  Date of Exam:   04/22/2023 Medical Rec #: [id number]        Accession #:    [id number] Date of Birth: 06-Nov-1995       Patient Gender: F Patient Age:   85 years Exam Location:  [facility] Procedure:      VAS [place] LOWER EXTREMITY VENOUS (DVT) Referring Phys: Emalea Mix --------------------------------------------------------------------------------  Indications: Pain.  Comparison Study:  Previous study 3.26.2024. Performing Technologist: Fernande Bras  Examination Guidelines: A complete evaluation includes B-mode imaging, spectral Doppler, color Doppler, and power Doppler as needed of all accessible portions of each vessel. Bilateral testing is considered an integral part of a complete examination. Limited examinations for reoccurring indications may be performed as noted. The reflux portion of the exam is performed with the patient in reverse Trendelenburg.  +-----+---------------+---------+-----------+----------+--------------+ RIGHTCompressibilityPhasicitySpontaneityPropertiesThrombus Aging +-----+---------------+---------+-----------+----------+--------------+ CFV  Full           Yes      Yes                                 +-----+---------------+---------+-----------+----------+--------------+  SFJ  Full           Yes      Yes                                 +-----+---------------+---------+-----------+----------+--------------+   +---------+---------------+---------+-----------+----------+--------------+ LEFT     CompressibilityPhasicitySpontaneityPropertiesThrombus Aging +---------+---------------+---------+-----------+----------+--------------+ CFV      Full           Yes      Yes                                 +---------+---------------+---------+-----------+----------+--------------+ SFJ      Full           Yes      Yes                                 +---------+---------------+---------+-----------+----------+--------------+ FV Prox  Full                                                        +---------+---------------+---------+-----------+----------+--------------+ FV Mid   Full                                                        +---------+---------------+---------+-----------+----------+--------------+ FV DistalFull                                                         +---------+---------------+---------+-----------+----------+--------------+ PFV      Full                                                        +---------+---------------+---------+-----------+----------+--------------+ POP      Full           Yes      Yes                                 +---------+---------------+---------+-----------+----------+--------------+ PTV      Full                                                        +---------+---------------+---------+-----------+----------+--------------+ PERO     Full                                                        +---------+---------------+---------+-----------+----------+--------------+  Summary: RIGHT: - No evidence of common femoral vein obstruction.   LEFT: - There is no evidence of deep vein thrombosis in the lower extremity.  - No cystic structure found in the popliteal fossa.  *See table(s) above for measurements and observations.    Preliminary     Procedures Procedures    Medications Ordered in ED Medications  ketorolac (TORADOL) 15 MG/ML injection 15 mg (has no administration in time range)    ED Course/ Medical Decision Making/ A&P                                 Medical Decision Making  This is an overall well-appearing 28 year old female with past medical history significant for asthma, anxiety who presents concern for left anterior leg pain intermittently for 2 days.  No previous history of blood clots, no hormonal birth control use, no recent travel.  She denies any chest pain, shortness of breath, hemoptysis.  She denies any injury of the left leg.  On physical exam she has tenderness to palpation diffusely of the anterior left tibia, and left thigh, no significant focal tenderness throughout the entire left leg.  No significant soft tissue swelling throughout.  No overlying skin changes.  Overall benign appearing left leg which is neurovascularly intact throughout with normal strength and  range of motion.  Patient raised concern about family history of blood clots, although I think this is overall less likely performed ultrasound of the left lower extremity which revealed no evidence of clot. given dose of Toradol for pain in the emergency department, encourage ibuprofen, Tylenol, muscle relaxant at home, encouraged orthopedic follow-up if pain continues.  Patient discharged in stable condition at this time. Final Clinical Impression(s) / ED Diagnoses Final diagnoses:  Left leg pain    Rx / DC Orders ED Discharge Orders          Ordered    methocarbamol (ROBAXIN) 500 MG tablet  2 times daily        04/22/23 0908              Gaudencio Chesnut, Edyth Gunnels 04/22/23 0908    Glynn Octave, MD 04/22/23 1346

## 2023-04-22 NOTE — ED Notes (Signed)
 Pt continuing to refuse pain medication until after ultrasound

## 2023-07-16 ENCOUNTER — Ambulatory Visit
Admission: EM | Admit: 2023-07-16 | Discharge: 2023-07-16 | Disposition: A | Discharge status: Home / Self Care | Attending: Family Medicine | Admitting: Family Medicine

## 2023-07-16 DIAGNOSIS — N760 Acute vaginitis: Secondary | ICD-10-CM

## 2023-07-16 DIAGNOSIS — Z113 Encounter for screening for infections with a predominantly sexual mode of transmission: Secondary | ICD-10-CM

## 2023-07-16 DIAGNOSIS — R300 Dysuria: Secondary | ICD-10-CM | POA: Diagnosis present

## 2023-07-16 LAB — POCT URINALYSIS DIP (MANUAL ENTRY)
Bilirubin, UA: NEGATIVE
Blood, UA: NEGATIVE
Glucose, UA: NEGATIVE mg/dL
Ketones, POC UA: NEGATIVE mg/dL
Nitrite, UA: NEGATIVE
Protein Ur, POC: 30 mg/dL — AB
Spec Grav, UA: 1.02
Urobilinogen, UA: 2 U/dL — AB
pH, UA: 7

## 2023-07-16 LAB — POCT URINE PREGNANCY: Preg Test, Ur: NEGATIVE

## 2023-07-16 MED ORDER — PHENAZOPYRIDINE HCL 200 MG PO TABS: 200.0000 mg | ORAL_TABLET | Freq: Three times a day (TID) | ORAL | 0 refills | Status: DC | PRN

## 2023-07-16 NOTE — Discharge Instructions (Signed)
 We will base treatment off of your results. Make sure you hydrate very well with plain water and a quantity of 80 ounces of water a day.  Please limit drinks that are considered urinary irritants such as soda, sweet tea, coffee, energy drinks, alcohol.  These can worsen your urinary and genital symptoms but also be the source of them.  I will let you know about your vaginal swab and urine culture results through MyChart to see if we need to prescribe or change your antibiotics based off of those results.  If you continue to experience pain when you pee or start peeing frequently then let me know over the weekend and we will treat clinically for an UTI.

## 2023-07-16 NOTE — ED Triage Notes (Signed)
 Patient presents with vaginal discharge x 3 days. Patient would like STD-testing with blood work.

## 2023-07-16 NOTE — ED Provider Notes (Signed)
 [facility]  Note:  This document was prepared using Conservation officer, historic buildings and may include unintentional dictation errors.  MRN: [id number] DOB: 05/19/1995  Subjective:   Michele Jennings is a 28 y.o. female presenting for 3-day history of vaginal discharge and dysuria.  She is requesting a complete panel of STI testing.  Denies fever, n/v, abdominal pain, pelvic pain, rashes, urinary frequency, hematuria.  Hydrates primarily with water.  Avoids urinary irritants.  She did have unprotected sex recently.  No known exposures however.  No current facility-administered medications for this encounter.  Current Outpatient Medications:    albuterol  (PROVENTIL ) (2.5 MG/3ML) 0.083% nebulizer solution, Take 3 mLs (2.5 mg total) by nebulization every 6 (six) hours as needed for wheezing or shortness of breath., Disp: 75 mL, Rfl: 2   albuterol  (VENTOLIN  HFA) 108 (90 Base) MCG/ACT inhaler, Inhale 2 puffs into the lungs every 6 (six) hours as needed for wheezing or shortness of breath., Disp: , Rfl:    budesonide -formoterol  (SYMBICORT ) 80-4.5 MCG/ACT inhaler, Inhale 2 puffs into the lungs 2 (two) times daily., Disp: 1 each, Rfl: 12   fluticasone (FLONASE) 50 MCG/ACT nasal spray, Place 1 spray into both nostrils daily as needed for allergies., Disp: , Rfl:    loratadine  (CLARITIN ) 10 MG tablet, Take 1 tablet (10 mg total) by mouth daily., Disp: 15 tablet, Rfl: 1   methocarbamol  (ROBAXIN ) 500 MG tablet, Take 1 tablet (500 mg total) by mouth 2 (two) times daily., Disp: 20 tablet, Rfl: 0   montelukast (SINGULAIR) 10 MG tablet, Take by mouth., Disp: , Rfl:    Multiple Vitamin (MULTIVITAMIN) tablet, Take 1 tablet by mouth daily., Disp: , Rfl:    naproxen  (NAPROSYN ) 500 MG tablet, Take 1 tablet (500 mg total) by mouth 2 (two) times daily with a meal. (Patient not taking: Reported on 01/04/2023), Disp: 10 tablet, Rfl: 0   nitrofurantoin , macrocrystal-monohydrate, (MACROBID ) 100  MG capsule, Take 1 capsule (100 mg total) by mouth 2 (two) times daily. (Patient not taking: Reported on 01/04/2023), Disp: 10 capsule, Rfl: 0   norethindrone-ethinyl estradiol-FE (LOESTRIN FE) 1-20 MG-MCG tablet, Take 1 tablet by mouth daily., Disp: , Rfl:    omeprazole (PRILOSEC) 40 MG capsule, Take 1 capsule by mouth daily as needed., Disp: , Rfl:    ondansetron  (ZOFRAN ) 4 MG tablet, Take 1 tablet (4 mg total) by mouth every 8 (eight) hours as needed for nausea or vomiting. (Patient not taking: Reported on 01/04/2023), Disp: 12 tablet, Rfl: 0   ondansetron  (ZOFRAN -ODT) 4 MG disintegrating tablet, 4mg  ODT q4 hours prn nausea/vomit (Patient not taking: Reported on 01/04/2023), Disp: 10 tablet, Rfl: 0   oseltamivir  (TAMIFLU ) 75 MG capsule, Take 1 capsule (75 mg total) by mouth every 12 (twelve) hours. (Patient not taking: Reported on 01/04/2023), Disp: 10 capsule, Rfl: 0   predniSONE  (DELTASONE ) 20 MG tablet, Take 2 tablets (40 mg total) by mouth daily. (Patient not taking: Reported on 01/04/2023), Disp: 4 tablet, Rfl: 0   sucralfate  (CARAFATE ) 1 GM/10ML suspension, Take 10 mLs (1 g total) by mouth 4 (four) times daily -  with meals and at bedtime. (Patient not taking: Reported on 01/04/2023), Disp: 420 mL, Rfl: 0   Allergies  Allergen Reactions   Shellfish Allergy Shortness Of Breath and Other (See Comments)    "Seafood"  "Seafood"    "Seafood"   Milk Protein Other (See Comments)   Other     Other reaction(s): Other (See Comments) "Seafood"  Tilactase Diarrhea and Other (See Comments)    "dairy"  "dairy"  "dairy"  "dairy"    Past Medical History:  Diagnosis Date   Acid reflux    Anxiety 09/26/2021   Asthma    Chest pain 12/24/2020   Constipation 12/12/2021   Eczema    Food insecurity 09/26/2021   Gastroesophageal reflux disease 09/24/2021   Left upper arm pain 12/12/2021   Mild intermittent asthma 09/24/2021   Non-cardiac chest pain 12/12/2021   Obstructive sleep apnea  09/24/2021   Palpitations 12/24/2020   Seasonal allergies      Past Surgical History:  Procedure Laterality Date   lump removed from left leg.      Family History  Family history unknown: Yes    Social History   Tobacco Use   Smoking status: Never   Smokeless tobacco: Never  Vaping Use   Vaping status: Never Used  Substance Use Topics   Alcohol use: No   Drug use: No    ROS   Objective:   Vitals: BP 122/84 (BP Location: Left Arm)   Pulse 79   Temp 99.3 F (37.4 C) (Oral)   Resp 18   LMP 07/09/2023   SpO2 96%   Physical Exam Constitutional:      General: She is not in acute distress.    Appearance: Normal appearance. She is well-developed. She is not ill-appearing, toxic-appearing or diaphoretic.  HENT:     Head: Normocephalic and atraumatic.     Nose: Nose normal.     Mouth/Throat:     Mouth: Mucous membranes are moist.  Eyes:     General: No scleral icterus.       Right eye: No discharge.        Left eye: No discharge.     Extraocular Movements: Extraocular movements intact.     Conjunctiva/sclera: Conjunctivae normal.  Cardiovascular:     Rate and Rhythm: Normal rate.  Pulmonary:     Effort: Pulmonary effort is normal.  Abdominal:     General: Bowel sounds are normal. There is no distension.     Palpations: Abdomen is soft. There is no mass.     Tenderness: There is no abdominal tenderness. There is no right CVA tenderness, left CVA tenderness, guarding or rebound.  Skin:    General: Skin is warm and dry.  Neurological:     General: No focal deficit present.     Mental Status: She is alert and oriented to person, place, and time.  Psychiatric:        Mood and Affect: Mood normal.        Behavior: Behavior normal.        Thought Content: Thought content normal.        Judgment: Judgment normal.     Results for orders placed or performed during the hospital encounter of 07/16/23 (from the past 24 hours)  POCT urine pregnancy     Status:  Normal   Collection Time: 07/16/23  5:46 PM  Result Value Ref Range   Preg Test, Ur Negative   POCT urinalysis dipstick     Status: Abnormal   Collection Time: 07/16/23  5:46 PM  Result Value Ref Range   Color, UA yellow    Clarity, UA clear    Glucose, UA negative mg/dL   Bilirubin, UA negative    Ketones, POC UA negative mg/dL   Spec Grav, UA 1.308    Blood, UA negative    pH, UA 7.0  Protein Ur, POC =30 (A) mg/dL   Urobilinogen, UA 2.0 (A) E.U./dL   Nitrite, UA Negative    Leukocytes, UA Trace (A)     Assessment and Plan :   PDMP not reviewed this encounter.  1. Acute vaginitis   2. Screen for STD (sexually transmitted disease)   3. Dysuria    Recommend supportive care for now.  Patient was unable to provide much urine sample.  We did not have enough for an urine culture.  Vaginal cytology pending.  If patient remains symptomatic through the weekend especially for urinary symptoms, I am agreeable to treating clinically for an urinary tract infection.  Otherwise we will base treatment off of her vaginal cytology.  Counseled patient on potential for adverse effects with medications prescribed/recommended today, ER and return-to-clinic precautions discussed, patient verbalized understanding.    Adolph Hoop, [place] 07/16/23 1755

## 2023-07-17 LAB — RPR: RPR Ser Ql: NONREACTIVE

## 2023-07-17 LAB — HIV ANTIBODY (ROUTINE TESTING W REFLEX): HIV Screen 4th Generation wRfx: NONREACTIVE

## 2023-07-19 ENCOUNTER — Ambulatory Visit (HOSPITAL_COMMUNITY): Payer: Self-pay

## 2023-07-19 LAB — CERVICOVAGINAL ANCILLARY ONLY
Bacterial Vaginitis (gardnerella): POSITIVE — AB
Candida Glabrata: NEGATIVE
Candida Vaginitis: POSITIVE — AB
Chlamydia: NEGATIVE
Comment: NEGATIVE
Comment: NEGATIVE
Comment: NEGATIVE
Comment: NEGATIVE
Comment: NEGATIVE
Comment: NORMAL
Neisseria Gonorrhea: NEGATIVE
Trichomonas: NEGATIVE

## 2023-07-19 MED ORDER — FLUCONAZOLE 150 MG PO TABS: 150.0000 mg | ORAL_TABLET | Freq: Once | ORAL | 0 refills | Status: AC

## 2023-07-19 MED ORDER — METRONIDAZOLE 0.75 % VA GEL: 1.0000 | Freq: Every day | VAGINAL | 0 refills | Status: AC

## 2023-08-08 ENCOUNTER — Emergency Department (HOSPITAL_COMMUNITY)
Admission: EM | Admit: 2023-08-08 | Discharge: 2023-08-08 | Disposition: A | Discharge status: Home / Self Care | Attending: Emergency Medicine | Admitting: Emergency Medicine

## 2023-08-08 ENCOUNTER — Encounter (HOSPITAL_COMMUNITY): Payer: Self-pay | Admitting: Emergency Medicine

## 2023-08-08 ENCOUNTER — Other Ambulatory Visit: Payer: Self-pay

## 2023-08-08 DIAGNOSIS — K0889 Other specified disorders of teeth and supporting structures: Secondary | ICD-10-CM | POA: Insufficient documentation

## 2023-08-08 DIAGNOSIS — H9201 Otalgia, right ear: Secondary | ICD-10-CM | POA: Insufficient documentation

## 2023-08-08 DIAGNOSIS — Z7951 Long term (current) use of inhaled steroids: Secondary | ICD-10-CM | POA: Insufficient documentation

## 2023-08-08 DIAGNOSIS — J45909 Unspecified asthma, uncomplicated: Secondary | ICD-10-CM | POA: Diagnosis not present

## 2023-08-08 MED ORDER — KETOROLAC TROMETHAMINE 15 MG/ML IJ SOLN
15.0000 mg | Freq: Once | INTRAMUSCULAR | Status: AC
  Administered 2023-08-08: 15 mg via INTRAMUSCULAR
  Filled 2023-08-08: qty 1

## 2023-08-08 MED ORDER — OXYCODONE-ACETAMINOPHEN 5-325 MG PO TABS
1.0000 | ORAL_TABLET | Freq: Once | ORAL | Status: AC
  Administered 2023-08-08: 1 via ORAL
  Filled 2023-08-08: qty 1

## 2023-08-08 MED ORDER — AMOXICILLIN 400 MG/5ML PO SUSR: 500.0000 mg | Freq: Three times a day (TID) | ORAL | 0 refills | Status: AC

## 2023-08-08 NOTE — ED Triage Notes (Signed)
 Pt reports she had a wisdom tooth out last week and has not felt well since.  Occasional nausea, headache, ear pain, and generally feeling poorly.  No fever or chills.  Wants to be sure nothing is infected.

## 2023-08-08 NOTE — ED Provider Notes (Signed)
 [place] EMERGENCY DEPARTMENT AT [facility] Provider Note  CSN: [id number] Arrival date & time: 08/08/23 1920  Chief Complaint(s) Dental Problem  HPI Michele Jennings is a 28 y.o. female with right dental, jaw, ear pain s/p wisdom tooth extraction 10 days ago. Patient denies fevers or chills, no trouble swallowing or breathing. No longer taking anything for pain. Has dental f/u tomorrow    Past Medical History Past Medical History:  Diagnosis Date   Acid reflux    Anxiety 09/26/2021   Asthma    Chest pain 12/24/2020   Constipation 12/12/2021   Eczema    Food insecurity 09/26/2021   Gastroesophageal reflux disease 09/24/2021   Left upper arm pain 12/12/2021   Mild intermittent asthma 09/24/2021   Non-cardiac chest pain 12/12/2021   Obstructive sleep apnea 09/24/2021   Palpitations 12/24/2020   Seasonal allergies    Patient Active Problem List   Diagnosis Date Noted   Acid reflux 01/08/2022   Asthma 01/08/2022   Eczema 01/08/2022   Seasonal allergies 01/08/2022   Non-cardiac chest pain 12/12/2021   Constipation 12/12/2021   Left upper arm pain 12/12/2021   Anxiety 09/26/2021   Food insecurity 09/26/2021   Mild intermittent asthma 09/24/2021   Obstructive sleep apnea 09/24/2021   Gastroesophageal reflux disease 09/24/2021   Chest pain 12/24/2020   Palpitations 12/24/2020   Home Medication(s) Prior to Admission medications   Medication Sig Start Date End Date Taking? Authorizing Provider  amoxicillin  (AMOXIL ) 400 MG/5ML suspension Take 6.3 mLs (500 mg total) by mouth 3 (three) times daily for 7 days. 08/08/23 08/15/23 Yes Francesca Elsie CROME, MD  albuterol  (PROVENTIL ) (2.5 MG/3ML) 0.083% nebulizer solution Take 3 mLs (2.5 mg total) by nebulization every 6 (six) hours as needed for wheezing or shortness of breath. 01/04/23   Keith Sor, PA-C  albuterol  (VENTOLIN  HFA) 108 (90 Base) MCG/ACT inhaler Inhale 2 puffs into the lungs every 6 (six) hours as needed  for wheezing or shortness of breath.    [provider]  budesonide -formoterol  (SYMBICORT ) 80-4.5 MCG/ACT inhaler Inhale 2 puffs into the lungs 2 (two) times daily. 01/04/23   Keith Sor, PA-C  fluticasone (FLONASE) 50 MCG/ACT nasal spray Place 1 spray into both nostrils daily as needed for allergies. 05/12/22   [provider]  loratadine  (CLARITIN ) 10 MG tablet Take 1 tablet (10 mg total) by mouth daily. 01/04/23   Keith Sor, PA-C  methocarbamol  (ROBAXIN ) 500 MG tablet Take 1 tablet (500 mg total) by mouth 2 (two) times daily. 04/22/23   Prosperi, Christian H, PA-C  montelukast (SINGULAIR) 10 MG tablet Take by mouth. 05/12/22   [provider]  Multiple Vitamin (MULTIVITAMIN) tablet Take 1 tablet by mouth daily.    [provider]  naproxen  (NAPROSYN ) 500 MG tablet Take 1 tablet (500 mg total) by mouth 2 (two) times daily with a meal. Patient not taking: Reported on 01/04/2023 10/17/21   Palumbo, April, MD  nitrofurantoin , macrocrystal-monohydrate, (MACROBID ) 100 MG capsule Take 1 capsule (100 mg total) by mouth 2 (two) times daily. Patient not taking: Reported on 01/04/2023 03/04/22   Robinson, John K, PA-C  norethindrone-ethinyl estradiol-FE (LOESTRIN FE) 1-20 MG-MCG tablet Take 1 tablet by mouth daily. 01/01/23   [provider]  omeprazole (PRILOSEC) 40 MG capsule Take 1 capsule by mouth daily as needed. 09/21/22   [provider]  ondansetron  (ZOFRAN ) 4 MG tablet Take 1 tablet (4 mg total) by mouth every 8 (eight) hours as needed for nausea or vomiting.  Patient not taking: Reported on 01/04/2023 02/15/22   Griselda Norris, MD  ondansetron  (ZOFRAN -ODT) 4 MG disintegrating tablet 4mg  ODT q4 hours prn nausea/vomit Patient not taking: Reported on 01/04/2023 12/25/21   Patt Alm Macho, MD  oseltamivir  (TAMIFLU ) 75 MG capsule Take 1 capsule (75 mg total) by mouth every 12 (twelve) hours. Patient not taking: Reported on 01/04/2023 03/04/22    Robinson, John K, PA-C  phenazopyridine  (PYRIDIUM ) 200 MG tablet Take 1 tablet (200 mg total) by mouth 3 (three) times daily as needed for pain. 07/16/23   Christopher Savannah, PA-C  predniSONE  (DELTASONE ) 20 MG tablet Take 2 tablets (40 mg total) by mouth daily. Patient not taking: Reported on 01/04/2023 11/04/21   Patsey Lot, MD  sucralfate  (CARAFATE ) 1 GM/10ML suspension Take 10 mLs (1 g total) by mouth 4 (four) times daily -  with meals and at bedtime. Patient not taking: Reported on 01/04/2023 10/17/21   Nettie, April, MD                                                                                                                                    Past Surgical History Past Surgical History:  Procedure Laterality Date   lump removed from left leg.     Family History Family History  Family history unknown: Yes    Social History Social History   Tobacco Use   Smoking status: Never   Smokeless tobacco: Never  Vaping Use   Vaping status: Never Used  Substance Use Topics   Alcohol use: No   Drug use: No   Allergies Shellfish allergy, Milk protein, Other, and Tilactase  Review of Systems Review of Systems  All other systems reviewed and are negative.   Physical Exam Vital Signs  I have reviewed the triage vital signs BP 135/87   Pulse 80   Temp 98 F (36.7 C)   Resp 18   LMP 07/09/2023   SpO2 100%  Physical Exam Vitals and nursing note reviewed.  Constitutional:      Appearance: Normal appearance.  HENT:     Head: Normocephalic and atraumatic.     Right Ear: Tympanic membrane normal.     Left Ear: Tympanic membrane normal.     Mouth/Throat:     Mouth: Mucous membranes are moist.     Comments: No obvious dental abnormality. No oropharyngeal swelling. No facial swelling. Uvula midline   Eyes:     Conjunctiva/sclera: Conjunctivae normal.    Cardiovascular:     Rate and Rhythm: Normal rate.  Pulmonary:     Effort: Pulmonary effort is normal. No respiratory  distress.  Abdominal:     General: Abdomen is flat.   Musculoskeletal:        General: No deformity.   Skin:    General: Skin is warm and dry.     Capillary Refill: Capillary refill takes less than 2 seconds.   Neurological:  General: No focal deficit present.     Mental Status: She is alert. Mental status is at baseline.   Psychiatric:        Mood and Affect: Mood normal.        Behavior: Behavior normal.     ED Results and Treatments Labs (all labs ordered are listed, but only abnormal results are displayed) Labs Reviewed - No data to display                                                                                                                        Radiology No results found.  Pertinent labs & imaging results that were available during my care of the patient were reviewed by me and considered in my medical decision making (see MDM for details).  Medications Ordered in ED Medications  oxyCODONE-acetaminophen  (PERCOCET/ROXICET) 5-325 MG per tablet 1 tablet (1 tablet Oral Given 08/08/23 2232)  ketorolac  (TORADOL ) 15 MG/ML injection 15 mg (15 mg Intramuscular Given 08/08/23 2234)                                                                                                                                     Procedures Procedures  (including critical care time)  Medical Decision Making / ED Course   MDM:  28 y/o presenting with dental pain  Patient well appearing, exam without obvious acute process. Likely post operative pain. Patient reports she stopped taking her oral pain control. Ddx includes dental infection. Patient requests antibiotics, given increasing pain seems reasonable. Sent prescription for this. Recommended she discuss this with dentist tomorrow and d/c if they feel this is not necessary.      Medicines ordered and prescription drug management: Meds ordered this encounter  Medications   amoxicillin  (AMOXIL ) 400 MG/5ML suspension     Sig: Take 6.3 mLs (500 mg total) by mouth 3 (three) times daily for 7 days.    Dispense:  132.3 mL    Refill:  0   oxyCODONE-acetaminophen  (PERCOCET/ROXICET) 5-325 MG per tablet 1 tablet    Refill:  0   ketorolac  (TORADOL ) 15 MG/ML injection 15 mg    -I have reviewed the patients home medicines and have made adjustments as needed   Co morbidities that complicate the patient evaluation  Past Medical History:  Diagnosis Date   Acid reflux    Anxiety 09/26/2021   Asthma    Chest pain  12/24/2020   Constipation 12/12/2021   Eczema    Food insecurity 09/26/2021   Gastroesophageal reflux disease 09/24/2021   Left upper arm pain 12/12/2021   Mild intermittent asthma 09/24/2021   Non-cardiac chest pain 12/12/2021   Obstructive sleep apnea 09/24/2021   Palpitations 12/24/2020   Seasonal allergies       Dispostion: Disposition decision including need for hospitalization was considered, and patient discharged from emergency department.    Final Clinical Impression(s) / ED Diagnoses Final diagnoses:  Pain, dental     This chart was dictated using voice recognition software.  Despite best efforts to proofread,  errors can occur which can change the documentation meaning.    Francesca Elsie CROME, MD 08/08/23 [phone]

## 2023-08-08 NOTE — Discharge Instructions (Signed)
 We evaluated you for your dental pain.  Your examination does not show any dangerous findings such as a severe infection.  We believe it is safe to go home, especially since you are following up with your dentist tomorrow.  Please be sure to go to your appointment as scheduled.  We are not sure if you might have an early dental infection, so we have prescribed antibiotics.  Please take this as prescribed and discussed this with your dentist tomorrow.  If they do not think you need the antibiotics, then you can stop taking them.  Please return for any new or worsening symptoms such as trouble swallowing, trouble breathing, or any other new symptoms.

## 2023-08-20 IMAGING — DX DG CHEST 1V
1 series · 1 of 1 positions shown · non-contrast
Comparison: 02/06/2021

CLINICAL DATA: Chest pain

EXAM:
CHEST  1 VIEW

[w chest pa]
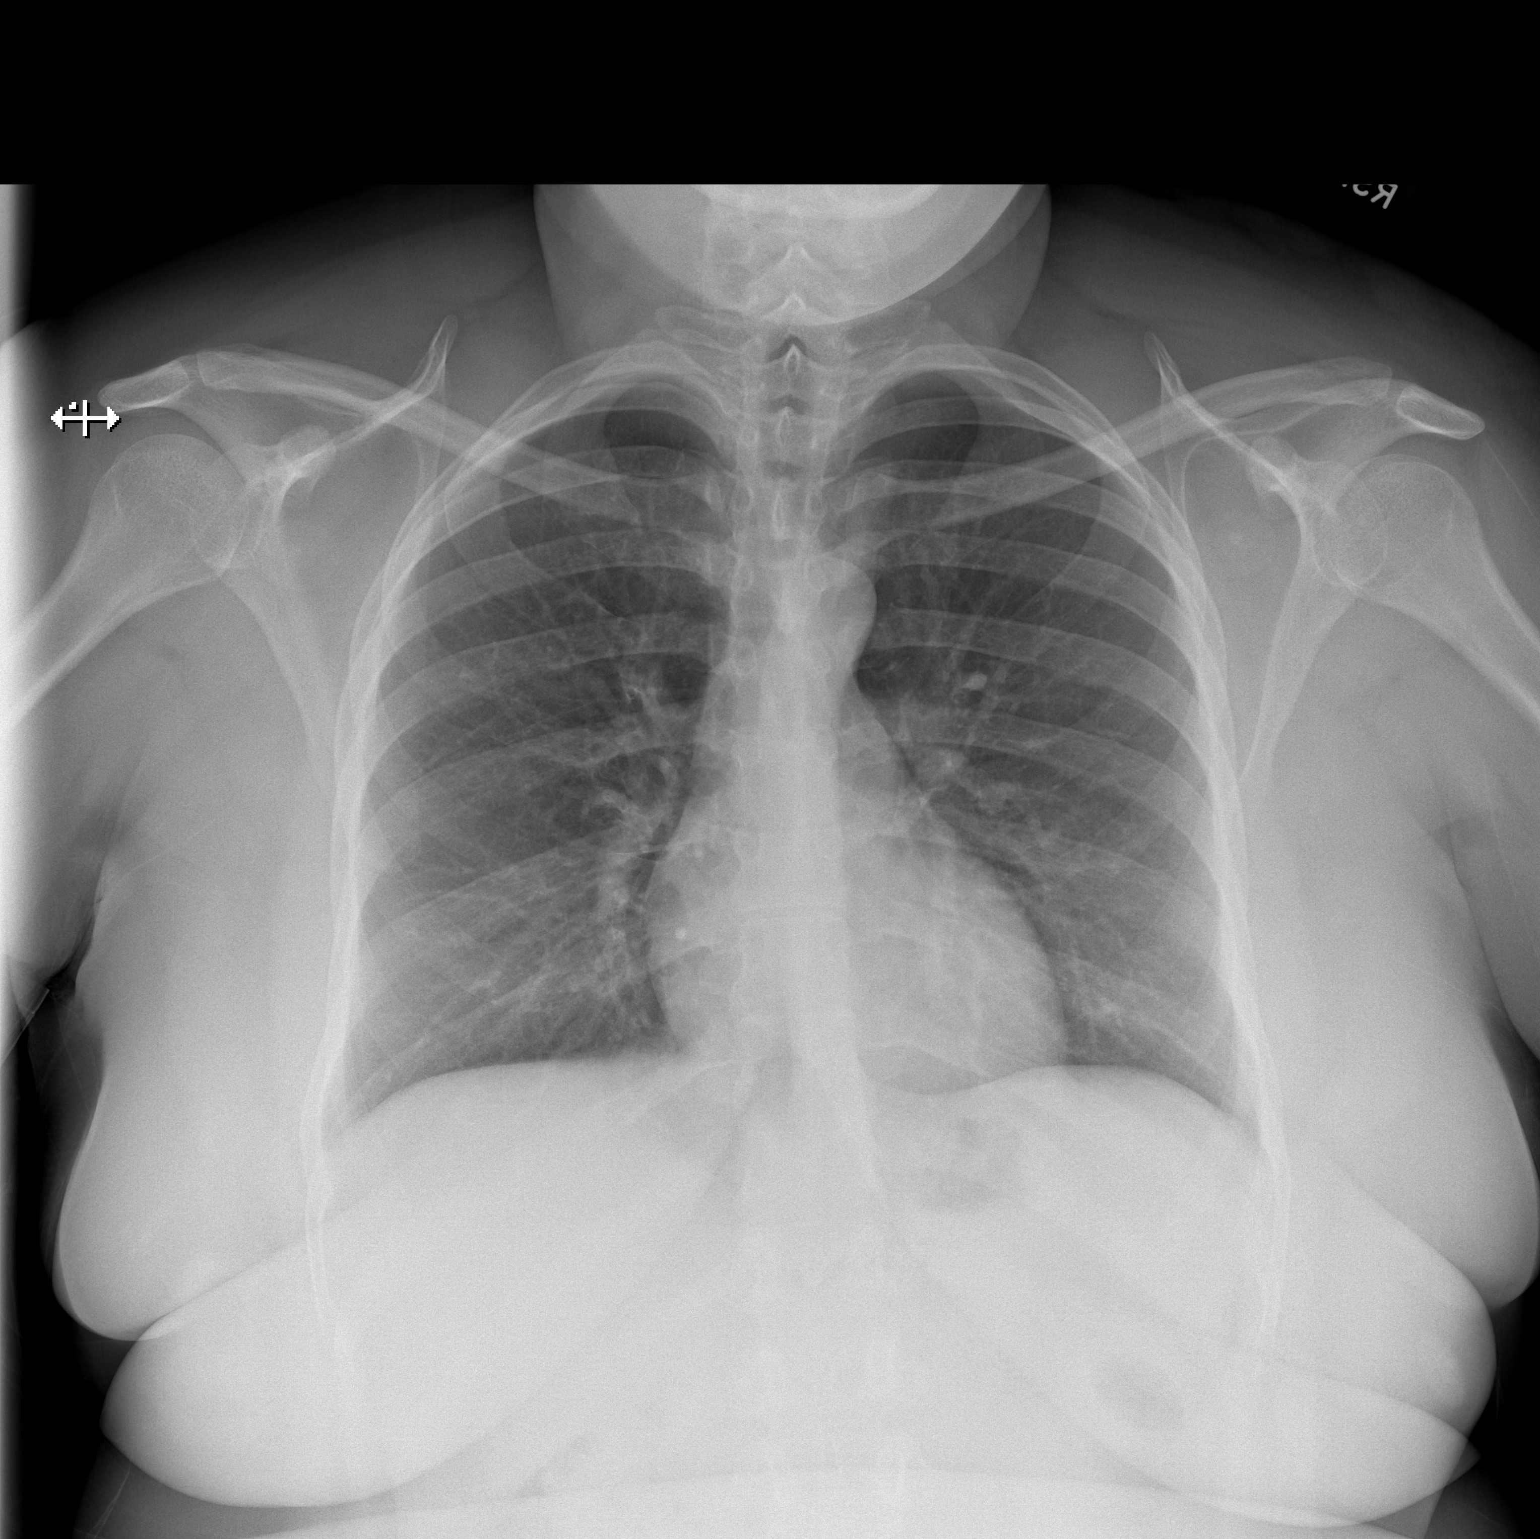

[1 of 1 positions shown; findings below may reference images not displayed]

FINDINGS: Unchanged cardiomediastinal silhouette. No focal airspace
consolidation. There is no large pleural effusion. There is no
visible pneumothorax. There is no acute osseous abnormality.
IMPRESSION: No evidence of acute cardiopulmonary disease.

## 2023-08-29 IMAGING — CT CT CHEST W/O CM
2 of 5 series · 13 of 36 positions shown, 16 images · non-contrast
Comparison: None.

CLINICAL DATA: Chronic dyspnea of unknown etiology. Sharp chest
pain radiating into left arm for 2 weeks.



[Series 4: thorax 2.0 · axial · 0.82mm/px · z∈[+1566,+1800]mm · 10 of 144 slices shown, 13 images]
[im 14/144  mediastinal]
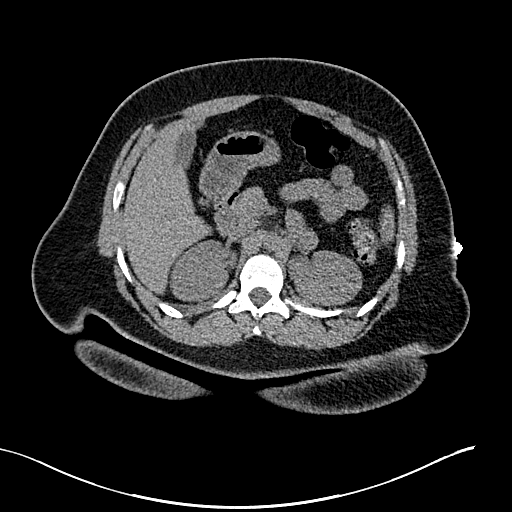
[im 14/144  lung]
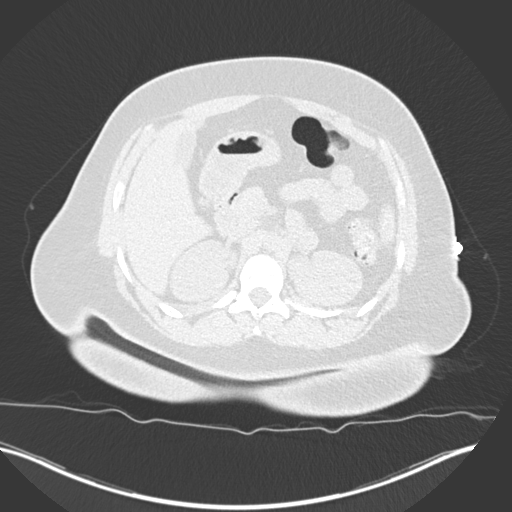
[im 27/144  lung]
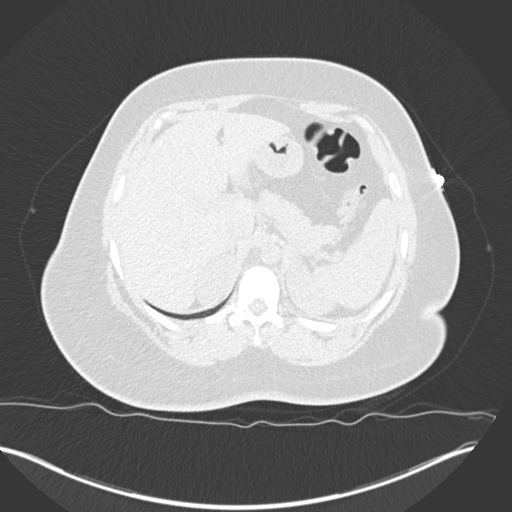
[im 40/144  lung]
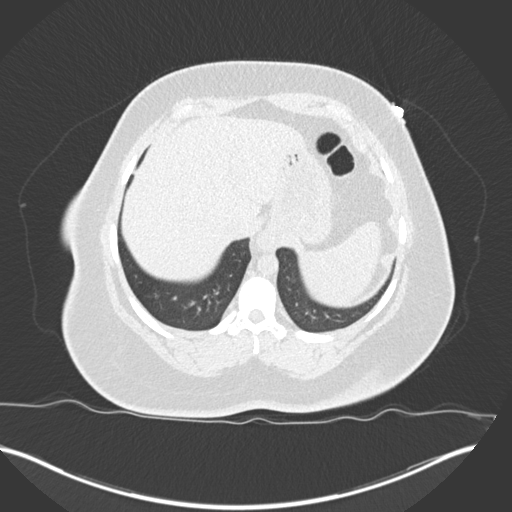
[im 53/144  lung]
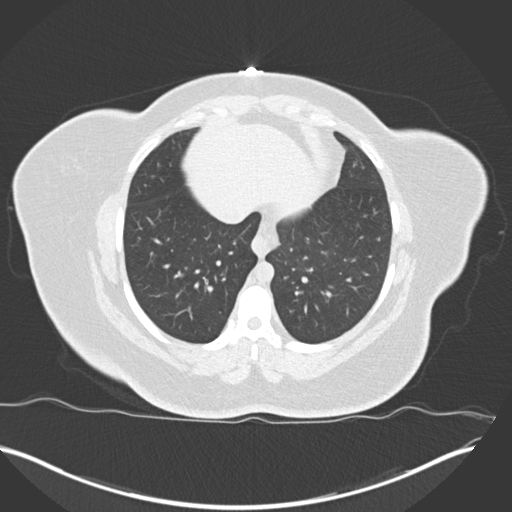
[im 66/144  mediastinal]
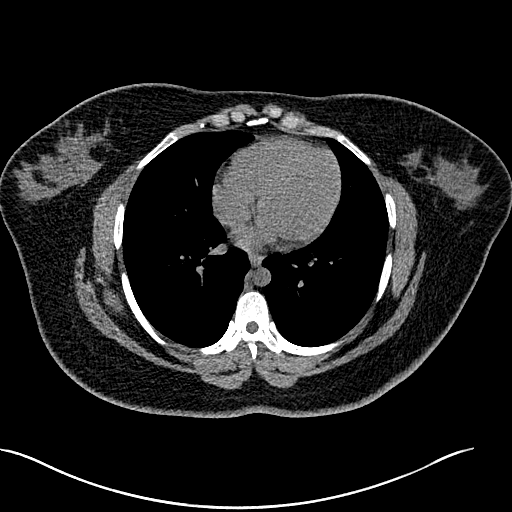
[im 66/144  lung]
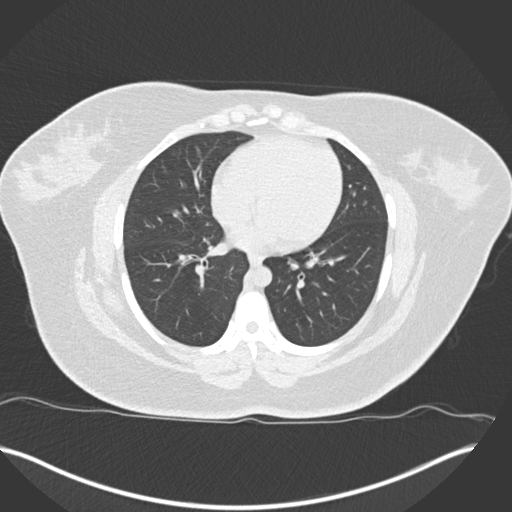
[im 79/144  lung]
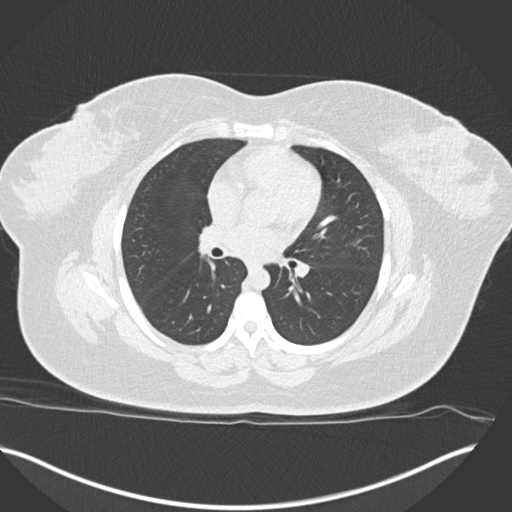
[im 92/144  lung]
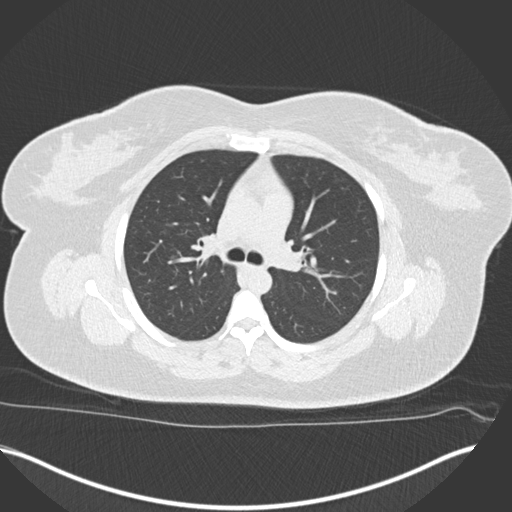
[im 105/144  lung]
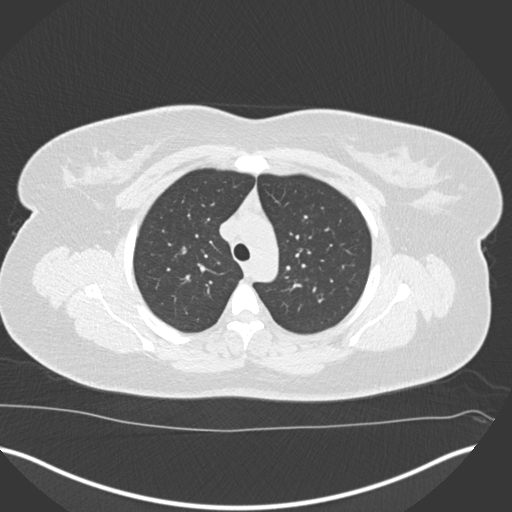
[im 118/144  mediastinal]
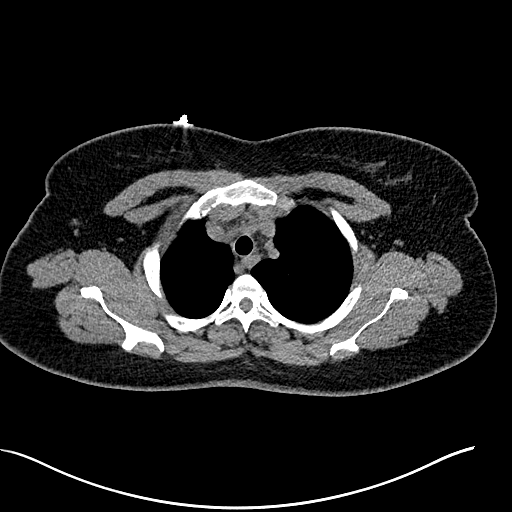
[im 118/144  lung]
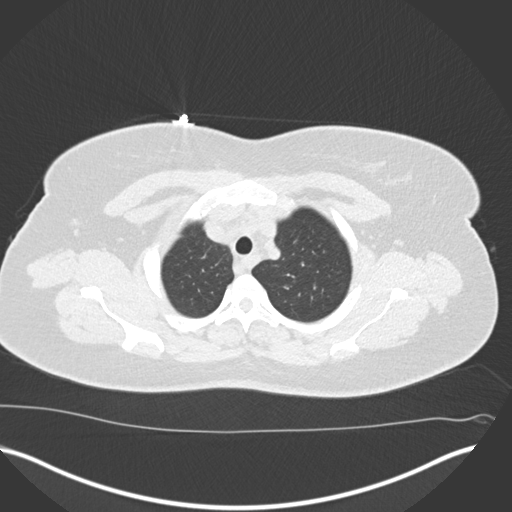
[im 131/144  lung]
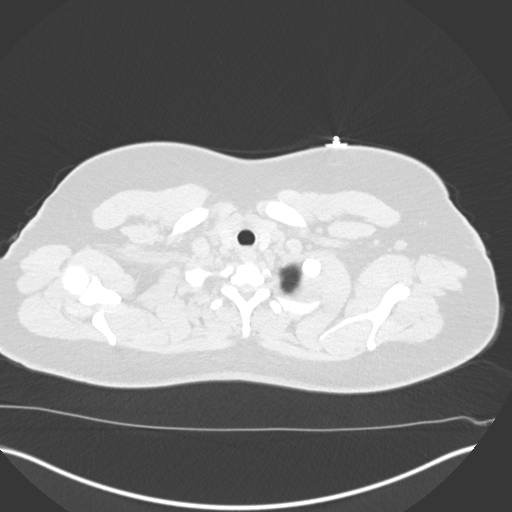

[Series 7: coronal · coronal · 0.59mm/px · 3 of 94 slices shown]
[im 19/94  lung]
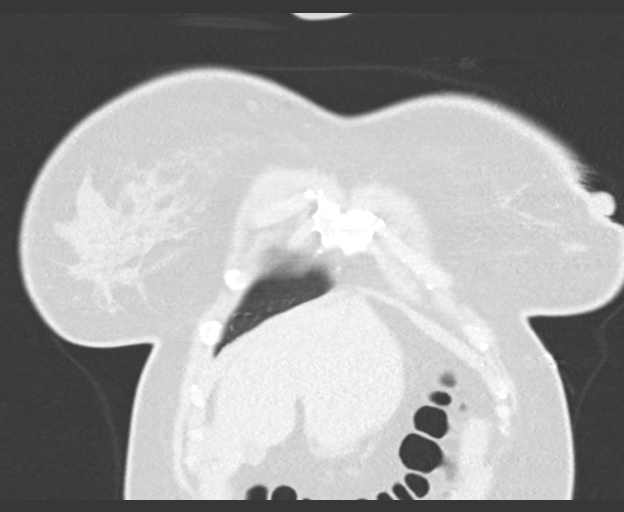
[im 38/94  lung]
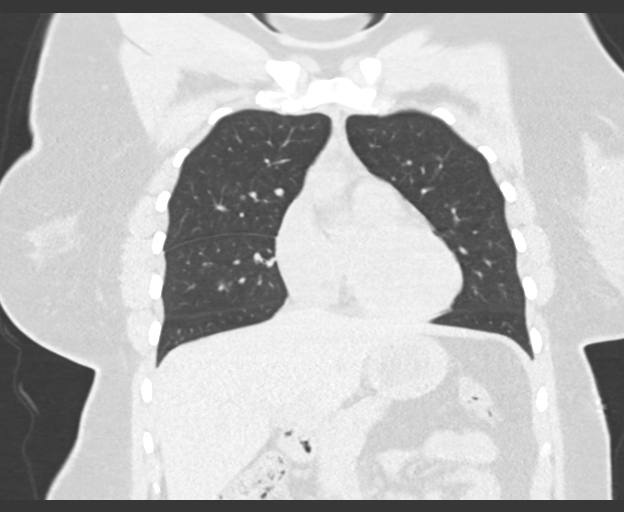
[im 56/94  lung]
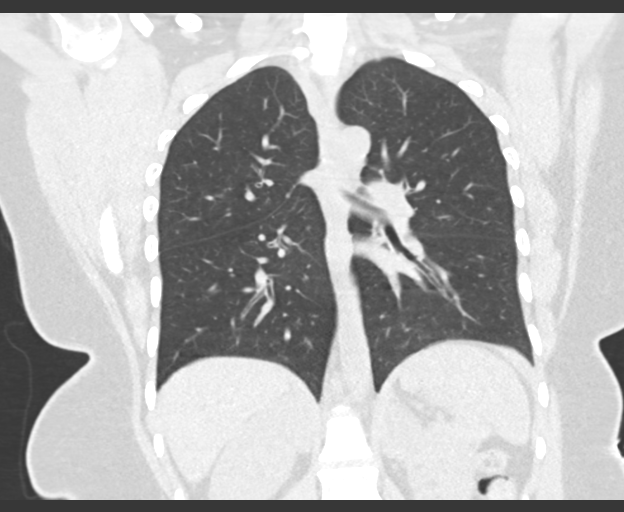

[13 of 36 positions shown; findings below may reference images not displayed]

FINDINGS: Cardiovascular: No significant vascular findings. Normal heart size.
No pericardial effusion.

Mediastinum/Nodes: No enlarged mediastinal or axillary lymph nodes.
Thyroid gland, trachea, and esophagus demonstrate no significant
findings.

Lungs/Pleura: No pleural effusion, airspace consolidation, or
pneumothorax. No atelectasis identified no signs of interstitial
lung disease. No suspicious lung nodules.

Upper Abdomen: No acute findings. Small hiatal hernia identified,
image 40/3.

Musculoskeletal: No acute or suspicious osseous findings. No chest
wall mass identified.
IMPRESSION: 1. No active cardiopulmonary abnormalities.
2. Small hiatal hernia.

## 2023-08-29 IMAGING — DX DG CHEST 2V
2 series · 2 of 2 positions shown · non-contrast
Comparison: 02/25/2021

CLINICAL DATA: Chest pain

EXAM:
CHEST - 2 VIEW

[chest pa]
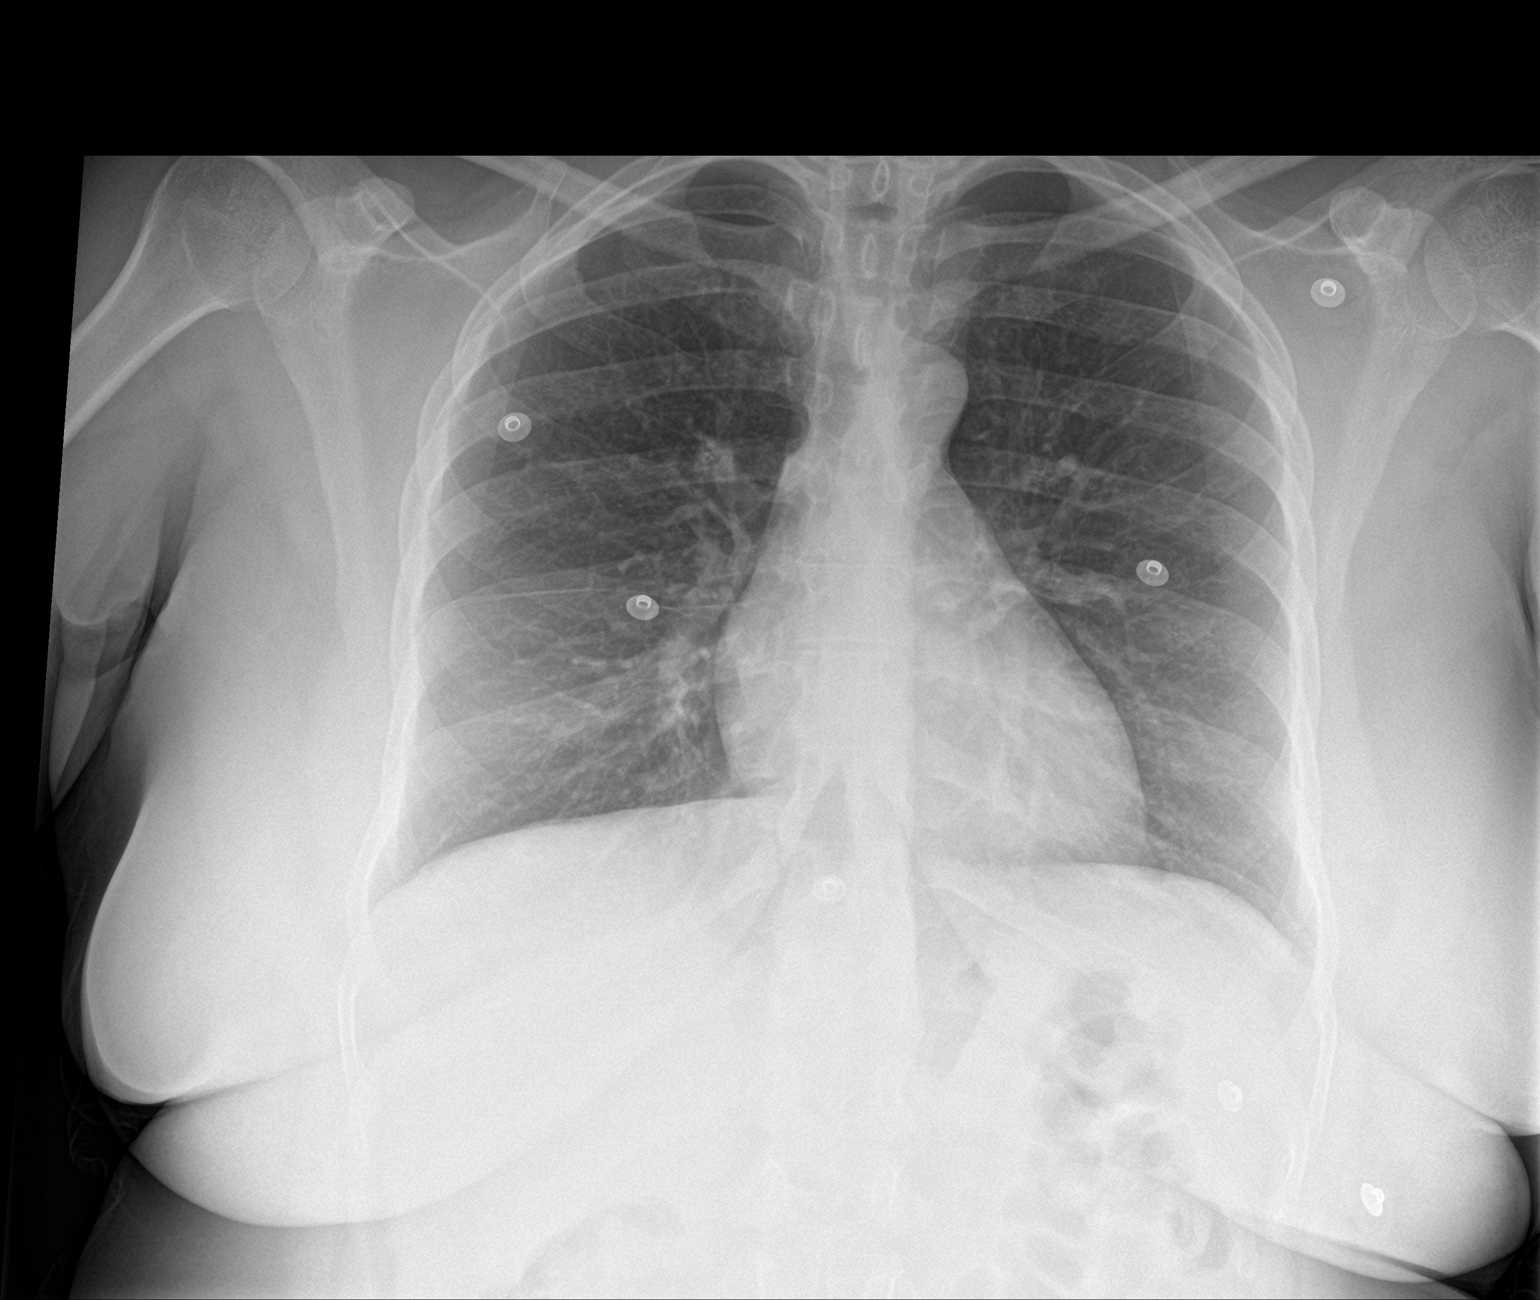

[chest lat]
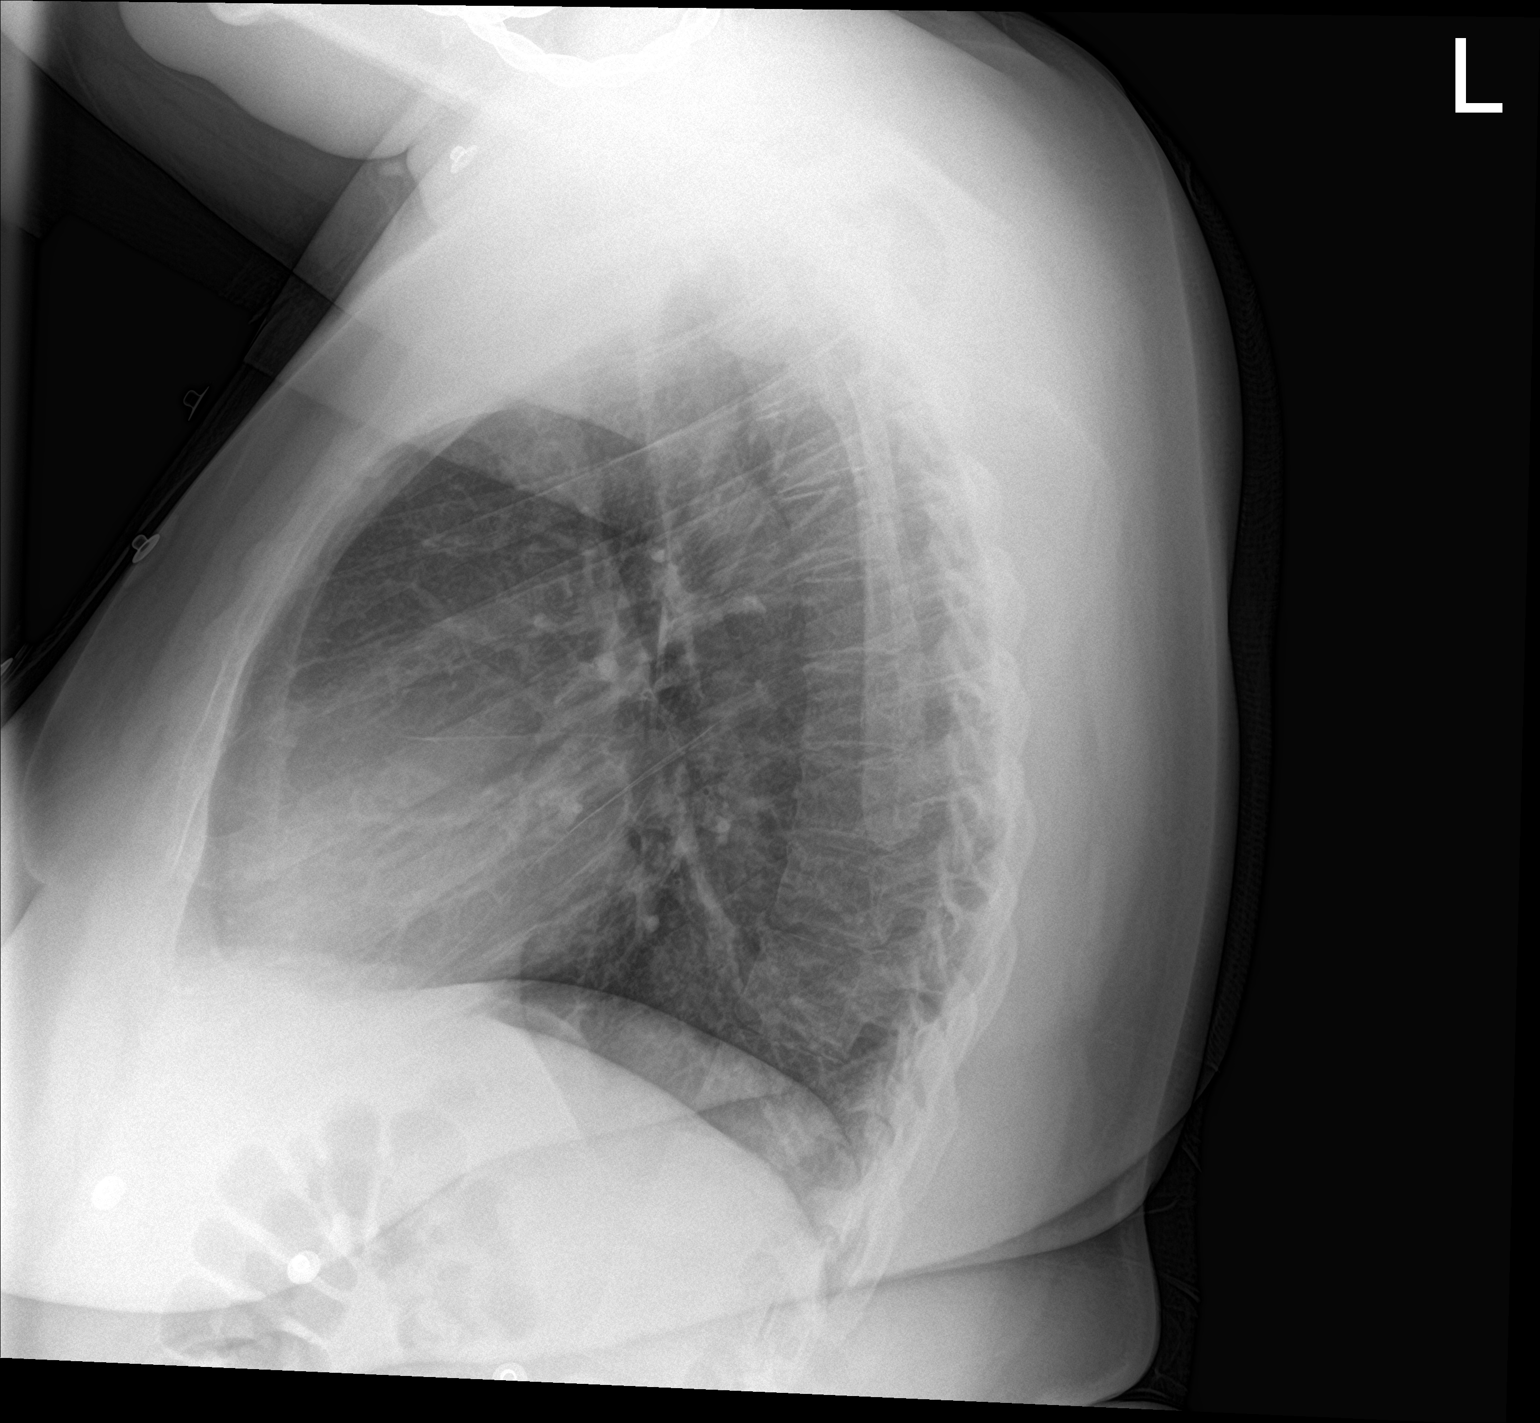

[2 of 2 positions shown; findings below may reference images not displayed]

FINDINGS: The heart size and mediastinal contours are within normal limits.
Both lungs are clear. The visualized skeletal structures are
unremarkable.
IMPRESSION: Normal study.

## 2023-09-27 ENCOUNTER — Emergency Department (HOSPITAL_BASED_OUTPATIENT_CLINIC_OR_DEPARTMENT_OTHER)

## 2023-09-27 ENCOUNTER — Encounter (HOSPITAL_BASED_OUTPATIENT_CLINIC_OR_DEPARTMENT_OTHER): Payer: Self-pay

## 2023-09-27 ENCOUNTER — Emergency Department (HOSPITAL_BASED_OUTPATIENT_CLINIC_OR_DEPARTMENT_OTHER)
Admission: EM | Admit: 2023-09-27 | Discharge: 2023-09-27 | Disposition: A | Discharge status: Home / Self Care | Attending: Emergency Medicine | Admitting: Emergency Medicine

## 2023-09-27 ENCOUNTER — Other Ambulatory Visit: Payer: Self-pay

## 2023-09-27 DIAGNOSIS — Z7951 Long term (current) use of inhaled steroids: Secondary | ICD-10-CM | POA: Diagnosis not present

## 2023-09-27 DIAGNOSIS — M79605 Pain in left leg: Secondary | ICD-10-CM | POA: Diagnosis present

## 2023-09-27 DIAGNOSIS — Z7952 Long term (current) use of systemic steroids: Secondary | ICD-10-CM | POA: Insufficient documentation

## 2023-09-27 DIAGNOSIS — J45909 Unspecified asthma, uncomplicated: Secondary | ICD-10-CM | POA: Insufficient documentation

## 2023-09-27 MED ORDER — ACETAMINOPHEN 500 MG PO TABS
1000.0000 mg | ORAL_TABLET | Freq: Once | ORAL | Status: AC
  Administered 2023-09-27: 1000 mg via ORAL
  Filled 2023-09-27: qty 2

## 2023-09-27 NOTE — ED Triage Notes (Signed)
 Left calf pain x 2 days.   No obvious swelling, redness or warmth.

## 2023-09-27 NOTE — Discharge Instructions (Signed)
 Continue Tylenol  and ibuprofen  for pain.

## 2023-09-27 NOTE — ED Provider Notes (Signed)
 [place] [facility] Provider Note   CSN: [id number] Arrival date & time: 09/27/23  2030     Patient presents with: Leg Pain   Michele Jennings is a 28 y.o. female.   Patient here with left knee pain left calf pain.  History of blood clots in family.  Denies any trauma.  Denies any weakness numbness tingling.  No specifically new activities.  History of anxiety asthma.  Denies any fevers or chills.  Nothing makes worse or better.  Symptoms for the last 2 days.  The history is provided by the patient.       Prior to Admission medications   Medication Sig Start Date End Date Taking? Authorizing Provider  albuterol  (PROVENTIL ) (2.5 MG/3ML) 0.083% nebulizer solution Take 3 mLs (2.5 mg total) by nebulization every 6 (six) hours as needed for wheezing or shortness of breath. 01/04/23   Keith Sor, PA-C  albuterol  (VENTOLIN  HFA) 108 (90 Base) MCG/ACT inhaler Inhale 2 puffs into the lungs every 6 (six) hours as needed for wheezing or shortness of breath.    [provider]  budesonide -formoterol  (SYMBICORT ) 80-4.5 MCG/ACT inhaler Inhale 2 puffs into the lungs 2 (two) times daily. 01/04/23   Keith Sor, PA-C  fluticasone (FLONASE) 50 MCG/ACT nasal spray Place 1 spray into both nostrils daily as needed for allergies. 05/12/22   [provider]  loratadine  (CLARITIN ) 10 MG tablet Take 1 tablet (10 mg total) by mouth daily. 01/04/23   Keith Sor, PA-C  methocarbamol  (ROBAXIN ) 500 MG tablet Take 1 tablet (500 mg total) by mouth 2 (two) times daily. 04/22/23   Prosperi, Christian H, PA-C  montelukast (SINGULAIR) 10 MG tablet Take by mouth. 05/12/22   [provider]  Multiple Vitamin (MULTIVITAMIN) tablet Take 1 tablet by mouth daily.    [provider]  naproxen  (NAPROSYN ) 500 MG tablet Take 1 tablet (500 mg total) by mouth 2 (two) times daily with a meal. Patient not taking: Reported on 01/04/2023 10/17/21   Palumbo, April, MD   nitrofurantoin , macrocrystal-monohydrate, (MACROBID ) 100 MG capsule Take 1 capsule (100 mg total) by mouth 2 (two) times daily. Patient not taking: Reported on 01/04/2023 03/04/22   Robinson, John K, PA-C  norethindrone-ethinyl estradiol-FE (LOESTRIN FE) 1-20 MG-MCG tablet Take 1 tablet by mouth daily. 01/01/23   [provider]  omeprazole (PRILOSEC) 40 MG capsule Take 1 capsule by mouth daily as needed. 09/21/22   [provider]  ondansetron  (ZOFRAN ) 4 MG tablet Take 1 tablet (4 mg total) by mouth every 8 (eight) hours as needed for nausea or vomiting. Patient not taking: Reported on 01/04/2023 02/15/22   Griselda Norris, MD  ondansetron  (ZOFRAN -ODT) 4 MG disintegrating tablet 4mg  ODT q4 hours prn nausea/vomit Patient not taking: Reported on 01/04/2023 12/25/21   Patt Alm Macho, MD  oseltamivir  (TAMIFLU ) 75 MG capsule Take 1 capsule (75 mg total) by mouth every 12 (twelve) hours. Patient not taking: Reported on 01/04/2023 03/04/22   Robinson, John K, PA-C  phenazopyridine  (PYRIDIUM ) 200 MG tablet Take 1 tablet (200 mg total) by mouth 3 (three) times daily as needed for pain. 07/16/23   Christopher Savannah, PA-C  predniSONE  (DELTASONE ) 20 MG tablet Take 2 tablets (40 mg total) by mouth daily. Patient not taking: Reported on 01/04/2023 11/04/21   Patsey Lot, MD  sucralfate  (CARAFATE ) 1 GM/10ML suspension Take 10 mLs (1 g total) by mouth 4 (four) times daily -  with meals and at bedtime. Patient not taking: Reported  on 01/04/2023 10/17/21   Palumbo, April, MD    Allergies: Shellfish allergy, Milk protein, Other, and Tilactase    Review of Systems  Updated Vital Signs BP (!) 142/85 (BP Location: Right Arm)   Pulse 82   Temp 98.1 F (36.7 C) (Oral)   Resp 18   Ht 5' 1 (1.549 m)   Wt 81.6 kg   SpO2 99%   BMI 34.01 kg/m   Physical Exam Vitals and nursing note reviewed.  Constitutional:      General: She is not in acute distress.    Appearance: She is well-developed.   HENT:     Head: Normocephalic and atraumatic.  Eyes:     Conjunctiva/sclera: Conjunctivae normal.  Cardiovascular:     Rate and Rhythm: Normal rate and regular rhythm.     Pulses: Normal pulses.     Heart sounds: No murmur heard. Pulmonary:     Effort: Pulmonary effort is normal. No respiratory distress.     Breath sounds: Normal breath sounds.  Abdominal:     Palpations: Abdomen is soft.     Tenderness: There is no abdominal tenderness.  Musculoskeletal:        General: Tenderness present. No swelling.     Cervical back: Neck supple.     Comments: Tenderness to the left calf maybe some tenderness to the left anterior knee, no major erythema warmth or swelling normal range of motion no cellulitis  Skin:    General: Skin is warm and dry.     Capillary Refill: Capillary refill takes less than 2 seconds.  Neurological:     Mental Status: She is alert.     Sensory: No sensory deficit.     Motor: No weakness.  Psychiatric:        Mood and Affect: Mood normal.     (all labs ordered are listed, but only abnormal results are displayed) Labs Reviewed - No data to display  EKG: None  Radiology: DG Knee Complete 4 Views Left Result Date: 09/27/2023 CLINICAL DATA:  pain EXAM: LEFT KNEE - COMPLETE 4+ VIEW COMPARISON:  None Available. FINDINGS: No evidence of fracture, dislocation, or joint effusion. No evidence of arthropathy or other focal bone abnormality. Soft tissues are unremarkable. IMPRESSION: Negative. Electronically Signed   By: Morgane  Naveau M.D.   On: 09/27/2023 21:29   US  Venous Img Lower  Left (DVT Study) Result Date: 09/27/2023 CLINICAL DATA:  Left calf pain for 2 days EXAM: Left LOWER EXTREMITY VENOUS DOPPLER ULTRASOUND TECHNIQUE: Gray-scale sonography with compression, as well as color and duplex ultrasound, were performed to evaluate the deep venous system(s) from the level of the common femoral vein through the popliteal and proximal calf veins. COMPARISON:  None  Available. FINDINGS: VENOUS Normal compressibility of the common femoral, superficial femoral, and popliteal veins, as well as the visualized calf veins. Visualized portions of profunda femoral vein and great saphenous vein unremarkable. No filling defects to suggest DVT on grayscale or color Doppler imaging. Doppler waveforms show normal direction of venous flow, normal respiratory plasticity and response to augmentation. Limited views of the contralateral common femoral vein are unremarkable. OTHER None. Limitations: none IMPRESSION: Negative. Electronically Signed   By: Norman Gatlin M.D.   On: 09/27/2023 21:23     Procedures   Medications Ordered in the ED  acetaminophen  (TYLENOL ) tablet 1,000 mg (1,000 mg Oral Given 09/27/23 2122)  Medical Decision Making Amount and/or Complexity of Data Reviewed Radiology: ordered.  Risk OTC drugs.   Michele Jennings is here with left calf pain left knee pain.  History of blood clots in the family.  No major medical problems except for asthma.  Unremarkable vitals.  No fever.  Clinically I suspect that this is a musculoskeletal process.  But part of differential is DVT.  Will get x-ray and DVT study.  I have no concern for infectious process.  She has no redness cellulitis.  No concern for septic joint.  She is neurovascular mostly intact on exam.  Knee x-rays unremarkable.  DVT study negative.  Overall suspect muscle strain.  Recommend Tylenol  and ibuprofen .  Discharge.  Stay well-hydrated.  This chart was dictated using voice recognition software.  Despite best efforts to proofread,  errors can occur which can change the documentation meaning.      Final diagnoses:  Left leg pain    ED Discharge Orders     None          Ruthe Cornet, DO 09/27/23 2145

## 2023-10-13 ENCOUNTER — Emergency Department (HOSPITAL_BASED_OUTPATIENT_CLINIC_OR_DEPARTMENT_OTHER)
Admission: EM | Admit: 2023-10-13 | Discharge: 2023-10-14 | Disposition: A | Discharge status: Home / Self Care | Attending: Emergency Medicine | Admitting: Emergency Medicine

## 2023-10-13 ENCOUNTER — Other Ambulatory Visit: Payer: Self-pay

## 2023-10-13 DIAGNOSIS — R519 Headache, unspecified: Secondary | ICD-10-CM | POA: Diagnosis not present

## 2023-10-13 DIAGNOSIS — X58XXXA Exposure to other specified factors, initial encounter: Secondary | ICD-10-CM | POA: Insufficient documentation

## 2023-10-13 DIAGNOSIS — S0502XA Injury of conjunctiva and corneal abrasion without foreign body, left eye, initial encounter: Secondary | ICD-10-CM | POA: Insufficient documentation

## 2023-10-13 LAB — PREGNANCY, URINE: Preg Test, Ur: NEGATIVE

## 2023-10-13 MED ORDER — NAPROXEN 250 MG PO TABS
500.0000 mg | ORAL_TABLET | Freq: Once | ORAL | Status: AC
  Administered 2023-10-13: 500 mg via ORAL
  Filled 2023-10-13: qty 2

## 2023-10-13 MED ORDER — ACETAMINOPHEN 500 MG PO TABS
1000.0000 mg | ORAL_TABLET | Freq: Once | ORAL | Status: AC
  Administered 2023-10-13: 1000 mg via ORAL
  Filled 2023-10-13: qty 2

## 2023-10-13 NOTE — ED Triage Notes (Signed)
 L eye pain with clear drainage since yesterday. Pt states she got eye drops for the pain and has had no relief. Denies blurred vision. Reports L frontal HA as well. No N/V, dizziness.

## 2023-10-14 LAB — RESP PANEL BY RT-PCR (RSV, FLU A&B, COVID)  RVPGX2
Influenza A by PCR: NEGATIVE
Influenza B by PCR: NEGATIVE
Resp Syncytial Virus by PCR: NEGATIVE
SARS Coronavirus 2 by RT PCR: NEGATIVE

## 2023-10-14 MED ORDER — OFLOXACIN 0.3 % OP SOLN: 1.0000 [drp] | OPHTHALMIC | 0 refills | Status: DC

## 2023-10-14 MED ORDER — TETRACAINE HCL 0.5 % OP SOLN
2.0000 [drp] | Freq: Once | OPHTHALMIC | Status: AC
  Administered 2023-10-14: 2 [drp] via OPHTHALMIC
  Filled 2023-10-14: qty 4

## 2023-10-14 MED ORDER — FLUORESCEIN SODIUM 1 MG OP STRP
1.0000 | ORAL_STRIP | Freq: Once | OPHTHALMIC | Status: AC
  Administered 2023-10-14: 1 via OPHTHALMIC
  Filled 2023-10-14: qty 1

## 2023-10-14 NOTE — ED Provider Notes (Signed)
 [facility] Provider Note   CSN: [id number] Arrival date & time: 10/13/23  2316     Patient presents with: eye pain and Headache   Michele Jennings is a 28 y.o. female.   The history is provided by the patient.  Eye Pain This is a new problem. The current episode started 12 to 24 hours ago. The problem occurs constantly. The problem has not changed since onset.Pertinent negatives include no chest pain, no abdominal pain and no shortness of breath. Associated symptoms comments: Associated headache from the pain in the eye . Nothing aggravates the symptoms. Nothing relieves the symptoms. She has tried nothing for the symptoms. The treatment provided no relief.  Patient with pain and tearing and crusting of the left eye for 1 day and thinks head hurts a a reaction to the eye pain      Prior to Admission medications   Medication Sig Start Date End Date Taking? Authorizing Provider  ofloxacin  (OCUFLOX ) 0.3 % ophthalmic solution Place 1 drop into the left eye every 4 (four) hours. 10/14/23  Yes Rocquel Askren, MD  albuterol  (PROVENTIL ) (2.5 MG/3ML) 0.083% nebulizer solution Take 3 mLs (2.5 mg total) by nebulization every 6 (six) hours as needed for wheezing or shortness of breath. 01/04/23   Keith Sor, PA-C  albuterol  (VENTOLIN  HFA) 108 (90 Base) MCG/ACT inhaler Inhale 2 puffs into the lungs every 6 (six) hours as needed for wheezing or shortness of breath.    [provider]  budesonide -formoterol  (SYMBICORT ) 80-4.5 MCG/ACT inhaler Inhale 2 puffs into the lungs 2 (two) times daily. 01/04/23   Keith Sor, PA-C  fluticasone (FLONASE) 50 MCG/ACT nasal spray Place 1 spray into both nostrils daily as needed for allergies. 05/12/22   [provider]  loratadine  (CLARITIN ) 10 MG tablet Take 1 tablet (10 mg total) by mouth daily. 01/04/23   Keith Sor, PA-C  methocarbamol  (ROBAXIN ) 500 MG tablet Take 1 tablet (500 mg total) by mouth 2  (two) times daily. 04/22/23   Prosperi, Christian H, PA-C  montelukast (SINGULAIR) 10 MG tablet Take by mouth. 05/12/22   [provider]  Multiple Vitamin (MULTIVITAMIN) tablet Take 1 tablet by mouth daily.    [provider]  naproxen  (NAPROSYN ) 500 MG tablet Take 1 tablet (500 mg total) by mouth 2 (two) times daily with a meal. Patient not taking: Reported on 01/04/2023 10/17/21   Prudence Heiny, MD  nitrofurantoin , macrocrystal-monohydrate, (MACROBID ) 100 MG capsule Take 1 capsule (100 mg total) by mouth 2 (two) times daily. Patient not taking: Reported on 01/04/2023 03/04/22   Robinson, John K, PA-C  norethindrone-ethinyl estradiol-FE (LOESTRIN FE) 1-20 MG-MCG tablet Take 1 tablet by mouth daily. 01/01/23   [provider]  omeprazole (PRILOSEC) 40 MG capsule Take 1 capsule by mouth daily as needed. 09/21/22   [provider]  ondansetron  (ZOFRAN ) 4 MG tablet Take 1 tablet (4 mg total) by mouth every 8 (eight) hours as needed for nausea or vomiting. Patient not taking: Reported on 01/04/2023 02/15/22   Griselda Norris, MD  ondansetron  (ZOFRAN -ODT) 4 MG disintegrating tablet 4mg  ODT q4 hours prn nausea/vomit Patient not taking: Reported on 01/04/2023 12/25/21   Patt Alm Macho, MD  oseltamivir  (TAMIFLU ) 75 MG capsule Take 1 capsule (75 mg total) by mouth every 12 (twelve) hours. Patient not taking: Reported on 01/04/2023 03/04/22   Robinson, John K, PA-C  phenazopyridine  (PYRIDIUM ) 200 MG tablet Take 1 tablet (200 mg total) by mouth 3 (three)  times daily as needed for pain. 07/16/23   Christopher Savannah, PA-C  predniSONE  (DELTASONE ) 20 MG tablet Take 2 tablets (40 mg total) by mouth daily. Patient not taking: Reported on 01/04/2023 11/04/21   Patsey Lot, MD  sucralfate  (CARAFATE ) 1 GM/10ML suspension Take 10 mLs (1 g total) by mouth 4 (four) times daily -  with meals and at bedtime. Patient not taking: Reported on 01/04/2023 10/17/21   Dilan Novosad, MD     Allergies: Shellfish allergy, Milk protein, Other, and Tilactase    Review of Systems  Constitutional:  Negative for fever.  Eyes:  Positive for photophobia, pain and discharge. Negative for visual disturbance.  Respiratory:  Negative for shortness of breath.   Cardiovascular:  Negative for chest pain.  Gastrointestinal:  Negative for abdominal pain.    Updated Vital Signs BP (!) 135/94 (BP Location: Right Arm)   Pulse 82   Temp 97.7 F (36.5 C)   Resp 20   Ht 5' 1 (1.549 m)   Wt 81.6 kg   LMP 09/22/2023 (Approximate)   SpO2 99%   BMI 34.01 kg/m   Physical Exam Vitals and nursing note reviewed.  Constitutional:      General: She is not in acute distress.    Appearance: She is well-developed.  HENT:     Head: Normocephalic and atraumatic.     Nose: Nose normal.  Eyes:     General: Lids are normal. Lids are everted, no foreign bodies appreciated.     Extraocular Movements: Extraocular movements intact.     Left eye: Normal extraocular motion.     Pupils: Pupils are equal, round, and reactive to light.      Comments:   Visual Acuity  Right Eye Distance: 20/20 Left Eye Distance: 20/25 Bilateral Distance: 20/20       Cardiovascular:     Rate and Rhythm: Normal rate and regular rhythm.     Pulses: Normal pulses.     Heart sounds: Normal heart sounds.  Pulmonary:     Effort: Pulmonary effort is normal. No respiratory distress.     Breath sounds: Normal breath sounds.  Abdominal:     General: Bowel sounds are normal. There is no distension.     Palpations: Abdomen is soft.     Tenderness: There is no abdominal tenderness. There is no guarding or rebound.  Musculoskeletal:        General: Normal range of motion.     Cervical back: Neck supple.  Skin:    General: Skin is dry.     Capillary Refill: Capillary refill takes less than 2 seconds.     Findings: No erythema or rash.  Neurological:     General: No focal deficit present.     Deep Tendon Reflexes:  Reflexes normal.  Psychiatric:        Mood and Affect: Mood normal.     (all labs ordered are listed, but only abnormal results are displayed) Labs Reviewed  RESP PANEL BY RT-PCR (RSV, FLU A&B, COVID)  RVPGX2  PREGNANCY, URINE    EKG: None  Radiology: No results found.   Procedures   Medications Ordered in the ED  acetaminophen  (TYLENOL ) tablet 1,000 mg (1,000 mg Oral Given 10/13/23 2357)  naproxen  (NAPROSYN ) tablet 500 mg (500 mg Oral Given 10/13/23 2357)  fluorescein  ophthalmic strip 1 strip (1 strip Left Eye Given 10/14/23 0032)  tetracaine  (PONTOCAINE) 0.5 % ophthalmic solution 2 drop (2 drops Left Eye Given 10/14/23 0032)  Medical Decision Making Patient with 1 days of eye pain   Amount and/or Complexity of Data Reviewed External Data Reviewed: notes.    Details: Previous notes reviewed  Labs: ordered.  Risk OTC drugs. Prescription drug management. Risk Details: Patient with a corneal abrasion.  She does not wear contacts.  I have sent a prescription for antibiotic drops to her pharmacy.  Follow up with your eye care professional for ongoing care. Stable for discharge      Final diagnoses:  Abrasion of left cornea, initial encounter   No signs of systemic illness or infection. The patient is nontoxic-appearing on exam and vital signs are within normal limits. I have reviewed the triage vital signs and the nursing notes. Pertinent labs & imaging results that were available during my care of the patient were reviewed by me and considered in my medical decision making (see chart for details). After history, exam, and medical workup I feel the patient has been appropriately medically screened and is safe for discharge home. Pertinent diagnoses were discussed with the patient. Patient was given return precautions.     ED Discharge Orders          Ordered    ofloxacin  (OCUFLOX ) 0.3 % ophthalmic solution  Every 4 hours        10/14/23  0046               Demetres Prochnow, MD 10/14/23 9946

## 2024-01-22 ENCOUNTER — Other Ambulatory Visit: Payer: Self-pay

## 2024-01-22 ENCOUNTER — Encounter (HOSPITAL_BASED_OUTPATIENT_CLINIC_OR_DEPARTMENT_OTHER): Payer: Self-pay | Admitting: *Deleted

## 2024-01-22 ENCOUNTER — Emergency Department (HOSPITAL_BASED_OUTPATIENT_CLINIC_OR_DEPARTMENT_OTHER): Payer: Self-pay

## 2024-01-22 ENCOUNTER — Emergency Department (HOSPITAL_BASED_OUTPATIENT_CLINIC_OR_DEPARTMENT_OTHER)
Admission: EM | Admit: 2024-01-22 | Discharge: 2024-01-23 | Disposition: A | Discharge status: Home / Self Care | Payer: Self-pay | Attending: Emergency Medicine | Admitting: Emergency Medicine

## 2024-01-22 DIAGNOSIS — X500XXA Overexertion from strenuous movement or load, initial encounter: Secondary | ICD-10-CM | POA: Insufficient documentation

## 2024-01-22 DIAGNOSIS — M7651 Patellar tendinitis, right knee: Secondary | ICD-10-CM | POA: Insufficient documentation

## 2024-01-22 NOTE — ED Triage Notes (Signed)
 Patient BIB POV with c/o right knee pain. Pt sts - lifted something heavy at work and felt a pain in knee. Onset : Yesterday ( 12/12) Pt rates pain- 8/10. Pt was able to ambulate to Triage w/out WC. Tx at home: None

## 2024-01-23 MED ORDER — NAPROXEN 500 MG PO TABS: 500.0000 mg | ORAL_TABLET | Freq: Two times a day (BID) | ORAL | 0 refills | Status: AC

## 2024-01-23 MED ORDER — NAPROXEN 250 MG PO TABS
500.0000 mg | ORAL_TABLET | Freq: Once | ORAL | Status: AC
  Administered 2024-01-23: 500 mg via ORAL
  Filled 2024-01-23: qty 2

## 2024-01-23 NOTE — ED Provider Notes (Signed)
 [place] [facility]  Provider Note  CSN: [id number] Arrival date & time: 01/22/24 2302  History Chief Complaint  Patient presents with   Knee Pain    Right     Michele Jennings is a 28 y.o. female reports 2 days of R knee pain, worse with lifting heavy boxes at work at [employer]. No falls or direct injuries.    Home Medications Prior to Admission medications  Medication Sig Start Date End Date Taking? Authorizing Provider  albuterol  (PROVENTIL ) (2.5 MG/3ML) 0.083% nebulizer solution Take 3 mLs (2.5 mg total) by nebulization every 6 (six) hours as needed for wheezing or shortness of breath. 01/04/23   Keith Sor, PA-C  albuterol  (VENTOLIN  HFA) 108 (90 Base) MCG/ACT inhaler Inhale 2 puffs into the lungs every 6 (six) hours as needed for wheezing or shortness of breath.    [provider]  budesonide -formoterol  (SYMBICORT ) 80-4.5 MCG/ACT inhaler Inhale 2 puffs into the lungs 2 (two) times daily. 01/04/23   Keith Sor, PA-C  fluticasone (FLONASE) 50 MCG/ACT nasal spray Place 1 spray into both nostrils daily as needed for allergies. 05/12/22   [provider]  loratadine  (CLARITIN ) 10 MG tablet Take 1 tablet (10 mg total) by mouth daily. 01/04/23   Keith Sor, PA-C  methocarbamol  (ROBAXIN ) 500 MG tablet Take 1 tablet (500 mg total) by mouth 2 (two) times daily. 04/22/23   Prosperi, Christian H, PA-C  montelukast (SINGULAIR) 10 MG tablet Take by mouth. 05/12/22   [provider]  Multiple Vitamin (MULTIVITAMIN) tablet Take 1 tablet by mouth daily.    [provider]  naproxen  (NAPROSYN ) 500 MG tablet Take 1 tablet (500 mg total) by mouth 2 (two) times daily with a meal. 01/23/24   Roselyn Carlin NOVAK, MD  norethindrone-ethinyl estradiol-FE (LOESTRIN FE) 1-20 MG-MCG tablet Take 1 tablet by mouth daily. 01/01/23   [provider]  omeprazole (PRILOSEC) 40 MG capsule Take 1 capsule by mouth daily as needed. 09/21/22    [provider]     Allergies    Shellfish allergy, Milk protein, Other, and Tilactase   Review of Systems   Review of Systems Please see HPI for pertinent positives and negatives  Physical Exam BP 114/75 (BP Location: Right Arm)   Pulse 69   Temp 98.2 F (36.8 C) (Oral)   Resp 16   Ht 5' 1 (1.549 m)   Wt 86.2 kg   LMP 01/18/2024 (Exact Date)   SpO2 99%   BMI 35.90 kg/m   Physical Exam Vitals and nursing note reviewed.  Constitutional:      Appearance: Normal appearance.  HENT:     Head: Normocephalic and atraumatic.     Nose: Nose normal.     Mouth/Throat:     Mouth: Mucous membranes are moist.  Eyes:     Extraocular Movements: Extraocular movements intact.     Conjunctiva/sclera: Conjunctivae normal.  Cardiovascular:     Rate and Rhythm: Normal rate.  Pulmonary:     Effort: Pulmonary effort is normal.     Breath sounds: Normal breath sounds.  Abdominal:     General: Abdomen is flat.     Palpations: Abdomen is soft.     Tenderness: There is no abdominal tenderness.  Musculoskeletal:        General: Tenderness (anterior R knee) present. No swelling or deformity. Normal range of motion.     Cervical back: Neck supple.     Comments: Knee is stable  Skin:    General: Skin is warm and dry.  Neurological:     General: No focal deficit present.     Mental Status: She is alert.  Psychiatric:        Mood and Affect: Mood normal.     ED Results / Procedures / Treatments   EKG None  Procedures Procedures  Medications Ordered in the ED Medications  naproxen  (NAPROSYN ) tablet 500 mg (has no administration in time range)    Initial Impression and Plan  Patient here with R knee pain, worse with lifting. Exam is reassuring, suspect this is a tendonitis. I personally viewed the images from radiology studies and agree with radiologist interpretation: Xray is normal. Recommend knee brace, naprosyn , rest and ice. Ortho follow up if not improving.   ED  Course       MDM Rules/Calculators/A&P Medical Decision Making Problems Addressed: Patellar tendinitis of right knee: acute illness or injury  Amount and/or Complexity of Data Reviewed Radiology: ordered and independent interpretation performed. Decision-making details documented in ED Course.  Risk Prescription drug management.     Final Clinical Impression(s) / ED Diagnoses Final diagnoses:  Patellar tendinitis of right knee    Rx / DC Orders ED Discharge Orders          Ordered    naproxen  (NAPROSYN ) 500 MG tablet  2 times daily with meals        01/23/24 0026             Roselyn Carlin NOVAK, MD 01/23/24 [phone]

## 2024-01-26 ENCOUNTER — Emergency Department (HOSPITAL_BASED_OUTPATIENT_CLINIC_OR_DEPARTMENT_OTHER)
Admission: EM | Admit: 2024-01-26 | Discharge: 2024-01-26 | Disposition: A | Discharge status: Home / Self Care | Payer: Self-pay | Source: Home / Self Care | Attending: Emergency Medicine | Admitting: Emergency Medicine

## 2024-01-26 ENCOUNTER — Emergency Department (HOSPITAL_BASED_OUTPATIENT_CLINIC_OR_DEPARTMENT_OTHER): Payer: Self-pay

## 2024-01-26 ENCOUNTER — Encounter (HOSPITAL_BASED_OUTPATIENT_CLINIC_OR_DEPARTMENT_OTHER): Payer: Self-pay

## 2024-01-26 ENCOUNTER — Ambulatory Visit: Payer: Self-pay

## 2024-01-26 ENCOUNTER — Other Ambulatory Visit: Payer: Self-pay

## 2024-01-26 DIAGNOSIS — M79622 Pain in left upper arm: Secondary | ICD-10-CM | POA: Insufficient documentation

## 2024-01-26 NOTE — Telephone Encounter (Signed)
 FYI Only or Action Required?: FYI only for provider: appointment scheduled on 02/23/2024.  Patient was last seen in primary care on not yet established .  Called Nurse Triage reporting Arm Pain.  Symptoms began several months ago.  Interventions attempted: Other: went to ED.  Symptoms are: stable.  Triage Disposition: Information or Advice Only Call  Patient/caregiver understands and will follow disposition?: Yes Reason for Disposition  General information question, no triage required and triager able to answer question  Answer Assessment - Initial Assessment Questions 1. REASON FOR CALL: What is the main reason for your call? or How can I best help you?     To book new patient visit as advised by ED needs outpatient US  done for L axillary pain /under arm pain into the chest. Had CXR done today at ER. No new or worsening symptoms since ER visit this morning. Booked new patient visit 02/23/2024 and added to wait list.  Advised if new or worsening symptoms prior to appointment seek ER/UC. Does not have insurance was given billing number to inquire on cost of visit.   2. SYMPTOMS : Do you have any symptoms?      Yes for the past few months  Protocols used: Information Only Call - No Triage-A-AH   Copied from CRM [id number]. Topic: Clinical - Red Word Triage >> Jan 26, 2024  1:53 PM Wess RAMAN wrote: Red Word that prompted transfer to Nurse Triage: Sharp pains under left arm for 2 weeks. Pain level is 7. Discharged from hospital on 01/25/24 and was advised to get an ultrasound  No PCP, Need new patient appt. Wanted it at [facility] but they are not accepting new patients.

## 2024-01-26 NOTE — ED Notes (Signed)

## 2024-01-26 NOTE — ED Provider Notes (Signed)
 Michele Jennings Provider Note   CSN: [id number] Arrival date & time: 01/26/24  9864     Patient presents with: Armpit Pain   Michele Jennings is a 28 y.o. female.   The history is provided by the patient.  Michele Jennings is a 28 y.o. female who presents to the Emergency Department complaining of chest/axillary pain.  She presents to the emergency department for evaluation of pain that has been present for the last 2 to 3 months in the left axillary/anterior chest wall region.  It is described as a tenderness that has worsened over the last month.  She does not have any associated breast pain or swelling.  She has pain with palpation as well as movement of her chest wall.  She has a history of reflux, asthma, at bedtime.  LMP was December 9.  She is right-hand dominant.  No reported injuries.      Prior to Admission medications  Medication Sig Start Date End Date Taking? Authorizing Provider  albuterol  (PROVENTIL ) (2.5 MG/3ML) 0.083% nebulizer solution Take 3 mLs (2.5 mg total) by nebulization every 6 (six) hours as needed for wheezing or shortness of breath. 01/04/23   Michele Sor, PA-C  albuterol  (VENTOLIN  HFA) 108 (90 Base) MCG/ACT inhaler Inhale 2 puffs into the lungs every 6 (six) hours as needed for wheezing or shortness of breath.    [provider]  budesonide -formoterol  (SYMBICORT ) 80-4.5 MCG/ACT inhaler Inhale 2 puffs into the lungs 2 (two) times daily. 01/04/23   Michele Sor, PA-C  fluticasone (FLONASE) 50 MCG/ACT nasal spray Place 1 spray into both nostrils daily as needed for allergies. 05/12/22   [provider]  loratadine  (CLARITIN ) 10 MG tablet Take 1 tablet (10 mg total) by mouth daily. 01/04/23   Michele Sor, PA-C  methocarbamol  (ROBAXIN ) 500 MG tablet Take 1 tablet (500 mg total) by mouth 2 (two) times daily. 04/22/23   Michele Jennings, Michele H, PA-C  montelukast (SINGULAIR) 10 MG tablet Take by mouth. 05/12/22    [provider]  Multiple Vitamin (MULTIVITAMIN) tablet Take 1 tablet by mouth daily.    [provider]  naproxen  (NAPROSYN ) 500 MG tablet Take 1 tablet (500 mg total) by mouth 2 (two) times daily with a meal. 01/23/24   Michele Carlin NOVAK, MD  norethindrone-ethinyl estradiol-FE (LOESTRIN FE) 1-20 MG-MCG tablet Take 1 tablet by mouth daily. 01/01/23   [provider]  omeprazole (PRILOSEC) 40 MG capsule Take 1 capsule by mouth daily as needed. 09/21/22   [provider]    Allergies: Shellfish allergy, Milk protein, Other, and Tilactase    Review of Systems  All other systems reviewed and are negative.   Updated Vital Signs BP 119/70 (BP Location: Right Arm)   Pulse 73   Temp 98.3 F (36.8 C) (Oral)   Resp 15   Wt 86.2 kg   LMP 01/18/2024 (Exact Date)   SpO2 99%   BMI 35.90 kg/m   Physical Exam Vitals and nursing note reviewed.  Constitutional:      Appearance: She is well-developed.  HENT:     Head: Normocephalic and atraumatic.  Cardiovascular:     Rate and Rhythm: Normal rate and regular rhythm.  Pulmonary:     Effort: Pulmonary effort is normal. No respiratory distress.  Abdominal:     Palpations: Abdomen is soft.     Tenderness: There is no abdominal tenderness. There is no guarding or rebound.  Musculoskeletal:  General: No tenderness.     Comments: No breast masses or skin changes.  There is nodularity to bilateral axilla consistent with prior at bedtime.  There are no boggy or inflamed lymph nodes.  There is no swelling of the chest wall.  Skin:    General: Skin is warm and dry.  Neurological:     Mental Status: She is alert and oriented to person, place, and time.  Psychiatric:        Behavior: Behavior normal.     (all labs ordered are listed, but only abnormal results are displayed) Labs Reviewed - No data to display  EKG: None  Radiology: DG Chest 2 View Result Date: 01/26/2024 EXAM: 2 VIEW(S) XRAY OF THE  CHEST 01/26/2024 05:15:13 AM COMPARISON: 01/04/2023 CLINICAL HISTORY: left sided pain, no injury FINDINGS: LUNGS AND PLEURA: No focal pulmonary opacity. No pleural effusion. No pneumothorax. HEART AND MEDIASTINUM: No acute abnormality of the cardiac and mediastinal silhouettes. BONES AND SOFT TISSUES: No acute osseous abnormality. IMPRESSION: 1. No acute process. Electronically signed by: Michele Calk MD 01/26/2024 05:43 AM EST RP Workstation: [id number]     Procedures   Medications Ordered in the ED - No data to display                                  Medical Decision Making Amount and/or Complexity of Data Reviewed Radiology: ordered.   Patient here for evaluation of left axillary/chest wall tenderness and sensation of swelling.  There is no appreciable mass on examination.  She does have nodularity in the axillary region with apparent scars from prior at bedtime.  There is no evidence of active flare at this time.  Discussed with patient that she will require further outpatient follow-up for local ultrasound and evaluation.  Discussed outpatient follow-up as well as return precautions.  Chest x-ray is negative for acute abnormality.     Final diagnoses:  Axillary pain, left    ED Discharge Orders     None          Griselda Norris, MD 01/26/24 [phone]

## 2024-01-26 NOTE — Discharge Instructions (Addendum)
 This cause of your symptoms was not found today.  Please call your family doctor for further evaluation.  Get recheck if you develop swelling, fever, difficulty breathing, weakness or new concerning symptoms.    You may take ibuprofen  available over the counter according to label instructions as needed for pain.

## 2024-01-26 NOTE — ED Triage Notes (Signed)
 Arrives with c/o left armpit pain that started about a month ago. Pt denies injury or wound to area.

## 2024-02-21 NOTE — Progress Notes (Unsigned)
 "  New Patient Office Visit  Subjective    Patient ID: Michele Jennings, female    DOB: 03/15/1995  Age: 29 y.o. MRN: [id number]  CC: No chief complaint on file.   HPI Michele Jennings presents to establish care ***  Outpatient Encounter Medications as of 02/23/2024  Medication Sig   albuterol  (PROVENTIL ) (2.5 MG/3ML) 0.083% nebulizer solution Take 3 mLs (2.5 mg total) by nebulization every 6 (six) hours as needed for wheezing or shortness of breath.   albuterol  (VENTOLIN  HFA) 108 (90 Base) MCG/ACT inhaler Inhale 2 puffs into the lungs every 6 (six) hours as needed for wheezing or shortness of breath.   budesonide -formoterol  (SYMBICORT ) 80-4.5 MCG/ACT inhaler Inhale 2 puffs into the lungs 2 (two) times daily.   fluticasone (FLONASE) 50 MCG/ACT nasal spray Place 1 spray into both nostrils daily as needed for allergies.   loratadine  (CLARITIN ) 10 MG tablet Take 1 tablet (10 mg total) by mouth daily.   methocarbamol  (ROBAXIN ) 500 MG tablet Take 1 tablet (500 mg total) by mouth 2 (two) times daily.   montelukast (SINGULAIR) 10 MG tablet Take by mouth.   Multiple Vitamin (MULTIVITAMIN) tablet Take 1 tablet by mouth daily.   naproxen  (NAPROSYN ) 500 MG tablet Take 1 tablet (500 mg total) by mouth 2 (two) times daily with a meal.   norethindrone-ethinyl estradiol-FE (LOESTRIN FE) 1-20 MG-MCG tablet Take 1 tablet by mouth daily.   omeprazole (PRILOSEC) 40 MG capsule Take 1 capsule by mouth daily as needed.   No facility-administered encounter medications on file as of 02/23/2024.    Past Medical History:  Diagnosis Date   Acid reflux    Anxiety 09/26/2021   Asthma    Chest pain 12/24/2020   Constipation 12/12/2021   Eczema    Food insecurity 09/26/2021   Gastroesophageal reflux disease 09/24/2021   Left upper arm pain 12/12/2021   Mild intermittent asthma 09/24/2021   Non-cardiac chest pain 12/12/2021   Obstructive sleep apnea 09/24/2021   Palpitations 12/24/2020   Seasonal allergies      Past Surgical History:  Procedure Laterality Date   lump removed from left leg.      Family History  Family history unknown: Yes    Social History   Socioeconomic History   Marital status: Single    Spouse name: Not on file   Number of children: Not on file   Years of education: Not on file   Highest education level: Not on file  Occupational History   Not on file  Tobacco Use   Smoking status: Never   Smokeless tobacco: Never  Vaping Use   Vaping status: Never Used  Substance and Sexual Activity   Alcohol use: No   Drug use: No   Sexual activity: Not on file  Other Topics Concern   Not on file  Social History Narrative   Not on file   Social Drivers of Health   Tobacco Use: Low Risk (01/26/2024)   Patient History    Smoking Tobacco Use: Never    Smokeless Tobacco Use: Never    Passive Exposure: Not on file  Financial Resource Strain: Low Risk (03/15/2023)   Received from [facility]   Overall Financial Resource Strain (CARDIA)    Difficulty of Paying Living Expenses: Not hard at all  Food Insecurity: No Food Insecurity (03/15/2023)   Received from [facility]   Epic    Within the past 12 months, you worried that your food would run out before you  got the money to buy more.: Never true    Within the past 12 months, the food you bought just didn't last and you didn't have money to get more.: Never true  Transportation Needs: Unmet Transportation Needs (03/15/2023)   Received from [facility]   PRAPARE - Transportation    Lack of Transportation (Medical): Yes    Lack of Transportation (Non-Medical): Yes  Physical Activity: Not on file  Stress: Not on file  Social Connections: Not on file  Intimate Partner Violence: Not At Risk (03/31/2023)   Received from [facility]   HITS    Over the last 12 months how often did your partner physically hurt you?: Never    Over the last 12 months how often did your partner insult you or talk down to you?: Never     Over the last 12 months how often did your partner threaten you with physical harm?: Never    Over the last 12 months how often did your partner scream or curse at you?: Never  Depression (PHQ2-9): Not on file  Alcohol Screen: Not on file  Housing: Low Risk (03/15/2023)   Received from [facility]    In the last 12 months, was there a time when you were not able to pay the mortgage or rent on time?: No    In the past 12 months, how many times have you moved where you were living?: 1    At any time in the past 12 months, were you homeless or living in a shelter (including now)?: No  Utilities: Not At Risk (03/15/2023)   Received from [facility] Utilities    Threatened with loss of utilities: No  Health Literacy: Not on file    ROS      Objective    There were no vitals taken for this visit.  Physical Exam  {Labs (Optional):23779}    Assessment & Plan:   Problem List Items Addressed This Visit   None   No follow-ups on file.   Honora Seip, PA-C   "

## 2024-02-23 ENCOUNTER — Ambulatory Visit: Payer: Self-pay | Admitting: Physician Assistant

## 2024-02-23 DIAGNOSIS — L732 Hidradenitis suppurativa: Secondary | ICD-10-CM | POA: Insufficient documentation

## 2024-02-23 NOTE — Assessment & Plan Note (Signed)
 Referral to dermatology. Will manage flares as needed.
